# Patient Record
Sex: Female | Born: 1975 | Race: Asian | Hispanic: No | Marital: Married | Smoking: Never smoker
Health system: Southern US, Community
[De-identification: ages and names within clinical notes are randomized; demographics above are authoritative.]

## PROBLEM LIST (undated history)

## (undated) DIAGNOSIS — E785 Hyperlipidemia, unspecified: Secondary | ICD-10-CM

## (undated) DIAGNOSIS — IMO0002 Reserved for concepts with insufficient information to code with codable children: Secondary | ICD-10-CM

## (undated) DIAGNOSIS — E010 Iodine-deficiency related diffuse (endemic) goiter: Secondary | ICD-10-CM

## (undated) DIAGNOSIS — R000 Tachycardia, unspecified: Secondary | ICD-10-CM

## (undated) DIAGNOSIS — D649 Anemia, unspecified: Secondary | ICD-10-CM

## (undated) DIAGNOSIS — K219 Gastro-esophageal reflux disease without esophagitis: Secondary | ICD-10-CM

## (undated) HISTORY — PX: WISDOM TOOTH EXTRACTION: SHX21

## (undated) HISTORY — DX: Hyperlipidemia, unspecified: E78.5

---

## 1998-12-06 ENCOUNTER — Ambulatory Visit (HOSPITAL_COMMUNITY): Admission: RE | Admit: 1998-12-06 | Discharge: 1998-12-06 | Payer: Self-pay | Admitting: *Deleted

## 1998-12-10 ENCOUNTER — Ambulatory Visit (HOSPITAL_COMMUNITY): Admission: RE | Admit: 1998-12-10 | Discharge: 1998-12-10 | Payer: Self-pay | Admitting: *Deleted

## 2001-09-22 ENCOUNTER — Other Ambulatory Visit: Admission: RE | Admit: 2001-09-22 | Discharge: 2001-09-22 | Payer: Self-pay | Admitting: Endocrinology

## 2004-11-11 ENCOUNTER — Other Ambulatory Visit: Admission: RE | Admit: 2004-11-11 | Discharge: 2004-11-11 | Payer: Self-pay | Admitting: Obstetrics and Gynecology

## 2005-11-20 ENCOUNTER — Inpatient Hospital Stay (HOSPITAL_COMMUNITY): Admission: AD | Admit: 2005-11-20 | Discharge: 2005-11-20 | Payer: Self-pay | Admitting: *Deleted

## 2006-02-19 ENCOUNTER — Inpatient Hospital Stay (HOSPITAL_COMMUNITY): Admission: AD | Admit: 2006-02-19 | Discharge: 2006-02-22 | Payer: Self-pay | Admitting: Obstetrics and Gynecology

## 2006-04-02 ENCOUNTER — Other Ambulatory Visit: Admission: RE | Admit: 2006-04-02 | Discharge: 2006-04-02 | Payer: Self-pay | Admitting: Obstetrics and Gynecology

## 2007-01-14 ENCOUNTER — Ambulatory Visit (HOSPITAL_COMMUNITY): Admission: RE | Admit: 2007-01-14 | Discharge: 2007-01-14 | Payer: Self-pay | Admitting: Obstetrics and Gynecology

## 2007-01-14 ENCOUNTER — Encounter (INDEPENDENT_AMBULATORY_CARE_PROVIDER_SITE_OTHER): Payer: Self-pay | Admitting: Specialist

## 2007-10-25 ENCOUNTER — Encounter: Admission: RE | Admit: 2007-10-25 | Discharge: 2007-10-25 | Payer: Self-pay | Admitting: Endocrinology

## 2010-08-22 ENCOUNTER — Encounter: Admission: RE | Admit: 2010-08-22 | Discharge: 2010-08-22 | Payer: Self-pay | Admitting: Endocrinology

## 2010-09-23 ENCOUNTER — Other Ambulatory Visit
Admission: RE | Admit: 2010-09-23 | Discharge: 2010-09-23 | Payer: Self-pay | Source: Home / Self Care | Admitting: Interventional Radiology

## 2010-09-23 ENCOUNTER — Encounter
Admission: RE | Admit: 2010-09-23 | Discharge: 2010-09-23 | Payer: Self-pay | Source: Home / Self Care | Attending: Endocrinology | Admitting: Endocrinology

## 2010-10-12 NOTE — L&D Delivery Note (Signed)
Delivery Note At 4:08 PM a viable female was delivered via Vaginal, Spontaneous Delivery (Presentation: Right Occiput Anterior).  APGAR: 9, 9; weight 6 lb 11.2 oz (3039 g).   Placenta status: Intact, Spontaneous.  Cord: 3 vessels with the following complications: None.  Cord pH: not sent  Anesthesia: Epidural  Episiotomy: None Lacerations: 1st degree;Perineal;Vaginal Suture Repair: 2.0 vicryl Est. Blood Loss (mL): 300cc  Mom to postpartum.  Baby to nursery-stable.  Purcell Nails 06/16/2011, 5:37 PM

## 2010-11-25 LAB — CBC
HCT: 36 % (ref 36–46)
Platelets: 349 10*3/uL (ref 150–399)

## 2010-11-25 LAB — HEPATITIS B SURFACE ANTIGEN: Hepatitis B Surface Ag: NEGATIVE

## 2010-11-25 LAB — ANTIBODY SCREEN: Antibody Screen: NEGATIVE

## 2010-11-25 LAB — RUBELLA ANTIBODY, IGM: Rubella: IMMUNE

## 2010-11-25 LAB — ABO/RH

## 2011-02-27 NOTE — H&P (Signed)
NAME:  Zoe Brown, Zoe Brown                     ACCOUNT NO.:  [id number]   MEDICAL RECORD NO.:  [id number]          PATIENT TYPE:  MAT   LOCATION:  MATC                          [facility]ILITY:  [facility]   PHYSICIAN:  Naima A. Dillard, M.D. DATE OF BIRTH:  1976/08/27   DATE OF ADMISSION:  DATE OF DISCHARGE:                                HISTORY & PHYSICAL   Ms. Fujita is a 35 year old Chinese married female, gravida 2, para 0-0-1-0 who  presents to the office at 38-6/[redacted] weeks gestation reporting watery vaginal  discharge since this morning.  She reports irregular contractions  previously, but none now.  She reports previous bloody show, but none now.  She denies signs and symptoms of preeclampsia.  Her pregnancy has been  followed by the [facility] and  has been remarkable for  1.  Varicella nonimmune.  2.  Family history of hypertension.  3.  Positive group B Strep bacteriuria.   Her prenatal labs were collected on August 03, 2005.  Her hemoglobin was  10.5, hematocrit 33.1, platelets 359,000.  Blood type A+, antibody negative,  RPR nonreactive, rubella immune, hepatitis B surface antigen negative, HIV  nonreactive.  Pap smear from January 2006, was normal.  Gonorrhea negative,  chlamydia negative.  Cystic fibrosis negative and varicella nonimmune.  Her  1-hour Glucola from December 09, 2005, was 159 and her hemoglobin at that  time was 9.9.  Her RPR at that time was nonreactive.  Her 3-hour glucose  tolerance test from December 21, 2005, was normal.  Urine culture from August 03, 2005, was positive for group B Strep.   HISTORY OF PRESENT PREGNANCY:  The patient presented for care at [facility] on August 03, 2005, at 10-2/[redacted] weeks gestation.  She had an  ultrasound on that day giving her an [facility] of Feb 27, 2006.  Her urine was  sent to culture grew group B Strep.  The urinary tract infection was treated  with Penicillin Vee K and the patient completed that  treatment.  Of note.  The patient's quad screen was within normal limits.  Pregnancy  ultrasonography at 18 weeks shows growth consistent with previous dating.  The patient had an low renal threshold for glucose with a normal finger  sticks periodically during the pregnancy.  Ultrasonography at 26-1/2 weeks  shows growth consistent with previous dating.  The patient decided at 36  weeks to have C.N.M. care for her labor and birth.  Rest for prenatal care  was unremarkable.   OBSTETRIC HISTORY:  She is a graft gravida 2, para 0-0-1-0.  In February of  2000, she had a term elective AB at 12 weeks.   MEDICAL HISTORY:  No medication allergies.  She experienced menarche at the  age of 13-1/2 with 37 days cycles lasting 7 days.  She has used condoms in  the past for contraception.  She is immune to rubella.  She reports never  having had chickenpox.  She has taking Nexium in the past for heartburn.   SURGICAL HISTORY:  Remarkable for EAB in 2000 and a tooth extraction in  March 2006.   FAMILY MEDICAL HISTORY:  Remarkable for mother and maternal grandfather with  heart disease, multiple family members with hypertension, mother and  maternal grandfather with varicosities, maternal grandfather with diabetes.  Maternal grandfather and paternal grandfather with CVA.   GENETIC HISTORY:  Negative.   SOCIAL HISTORY:  The patient is married to the father of the baby, his name  is Astronomer.  He is involved and supportive.  Their first language is [place] but  they both speak English very well.  Father of the baby has two masters  degrees and is employed full-time as an Acupuncturist.  The patient  has a master's degree and is employed full-time as a Furniture conservator/restorer.  They deny any alcohol, tobacco or illicit drug use with the pregnancy.   OBJECTIVE:  VITAL SIGNS:  Stable.  She is afebrile.  HEENT:  Grossly within normal limits.  CHEST:  Clear to auscultation.  HEART:  Regular rate and  rhythm.  ABDOMEN:  Gravid in contour with fundal height extending approximately 37 cm  above the pubic symphysis.  Fetal heart rate is Dopplered in the 150s.  Uterine contractions are irregular per patient report.  Sterile speculum exam shows positive pooling, negative Nitrazine, positive  ferning.  Cervix is posterior, 1-2 cm, 70%, vertex -1.  EXTREMITIES:  Normal.   ASSESSMENT:  1.  Intrauterine pregnancy at term.  2.  Spontaneous rupture of membranes with no labor.  3.  Group B Strep positive.   PLAN:  1.  Admit to [facility]  2.  Routine C.N.M. orders.  3.  Begin penicillin for group B Strep prophylaxis.  4.  Further plan of care to be made with Chip Boer L. Emilee Hero, certified nurse      midwife      Cam Hai, C.N.M.      Naima A. Normand Sloop, M.D.  Electronically Signed    [place]/MEDQ  D:  02/19/2006  T:  02/19/2006  Job:  [id number]

## 2011-02-27 NOTE — Op Note (Signed)
Zoe Brown of Zoe Brown  [name] Visit Number: [id number] MRN: [id number]          Service Type: DSU Location: [facility] Attending Physician:  Zoe Brown Proc. Date: 12/06/98 Admit Date:  12/06/1998   CC:         Zoe Brown, M.D.                           Operative Report  PREOPERATIVE DIAGNOSES:       1. Intrauterine pregnancy at [redacted] weeks gestational ge                                  with request for termination.                               2. Rhodium (Rh) positive.  POSTOPERATIVE DIAGNOSES:      1. Intrauterine pregnancy at [redacted] weeks gestational ge                                  with request for termination.                               2. Rhodium (Rh) positive.  OPERATION:                    Dilatation and evacuation.  SURGEON:                      Zoe Brown, M.D.  ANESTHESIA:                   IV sedation and paracervical block.  ESTIMATED BLOOD LOSS:         80 cc.  TUBES AND DRAINS:             None.  COMPLICATIONS:                None.  INDICATIONS:                  35 year old woman, G-1, P-0, last menstrual period September 28, 1998, with first trimester pregnancy, who requests pregnancy termination.  FINDINGS:                     Preoperative uterine size 8-10 weeks, retroverted. Postoperative uterine size was 6-8 weeks.  Tissue obtained on curettage. Uterine cavity smooth.  SPECIMENS:                    Uterine curetting sent to pathology.  DESCRIPTION OF PROCEDURE:     After the establishment of IV sedation, the patient was placed in the dorsal lithotomy position.  The perineum and vagina were prepped with Betadine solution.  The patient was draped.  Examination under anesthesia or the above findings was carried out.  Graves speculum was inserted into the vagina and the cervix was reprepped with Betadine solution.  The anterior cervical lip was infiltrated with 1% Xylocaine and then grasped with a  single-toothed tenaculum.  Paracervical block was placed in the usual fashion using 1% Xylocaine (20 cc).  Uterine was sound was used to negotiate the direction of the  endocervical canal. Pratt dilators were used to dilate the cervix to #29 [place].  A #9 Simpson curet was passed easily into the endometrial cavity.  Suction curettage was performed. Tissue was obtained.  Gentle sharp curettage was performed.  A final pass with he suction curet was made.  The uterus was felt to be empty and smooth at the end f the case.  The instruments were removed and hemostasis was noted.  The patient as returned to the supine position and transferred to the recovery room in satisfactory condition. Attending Physician:  Zoe Brown DD:  12/06/98 TD:  12/06/98 Job: [id number]

## 2011-02-27 NOTE — Op Note (Signed)
NAME:  Zoe Brown, Zoe Brown                     ACCOUNT NO.:  [id number]   MEDICAL RECORD NO.:  [id number]          PATIENT TYPE:  AMB   LOCATION:  SDC                           [facility]:  [facility]   PHYSICIAN:  Maxie Better, M.D.DATE OF BIRTH:  07-12-1976   DATE OF PROCEDURE:  01/14/2007  DATE OF DISCHARGE:                               OPERATIVE REPORT   PREOPERATIVE DIAGNOSIS:  Elective termination.   PROCEDURE:  1. Suction dilation evacuation.  2. Endocervical polypectomy.   POSTOPERATIVE DIAGNOSES:  1. Elective termination.  2. Endocervical polyp.   ANESTHESIA:  MAC paracervical block.   SURGEON:  Maxie Better, M.D.   PROCEDURE:  Under adequate monitored anesthesia, the patient was placed  in the dorsal lithotomy position.  She was sterilely prepped and draped  in the usual fashion.  The bladder was catheterized for small amount of  urine.  Examination under anesthesia revealed about an 8 weeks size  uterus.  No adnexal masses could be appreciated.  A bivalve speculum was  placed in the vagina.  Twenty 20 mL of 1% Nesacaine was injected  paracervically at 3 and 9 o'clock.  The cervical os was notable for  protruding mass consistent with a polyp.  The single-tooth tenaculum was  placed on the anterior lip of the cervix.  The endocervical polyp was  removed with a polyp forcep.  The cervix was then serially dilated up to  #29 Pratt dilator and #7 mm curved suction cannula was introduced into  the uterine cavity.  Large amount of products of conception was then  obtained.  A #8 mm curved suction cannula was introduced into the  uterine cavity.  Additional tissue was obtained.  The cavity was then  suction curetted and suctioned until all tissue was felt to have been  removed at which time all instruments were then removed.  Specimen  labeled products of conception, endocervical polyp was sent to  pathology.  Estimated blood loss was minimal.  Complication was none.  The patient  tolerated the procedure well and was transferred to the  recovery room in stable condition.      Maxie Better, M.D.  Electronically Signed     [place]/MEDQ  D:  01/14/2007  T:  01/15/2007  Job:  [id number]

## 2011-03-17 ENCOUNTER — Other Ambulatory Visit: Payer: Self-pay

## 2011-04-02 ENCOUNTER — Other Ambulatory Visit: Payer: Self-pay

## 2011-04-18 IMAGING — US US BIOPSY
1 series · 12 of 12 positions shown · non-contrast
Comparison: 08/22/2010

CLINICAL DATA: Dominant complex cystic left lower pole thyroid
nodule

ULTRASOUND GUIDED NEEDLE ASPIRATE BIOPSY OF THE THYROID GLAND

[Series 1: us biopsy · 0.06mm/px · 12 acquisitions, 12 frames shown]
[im 1/12]
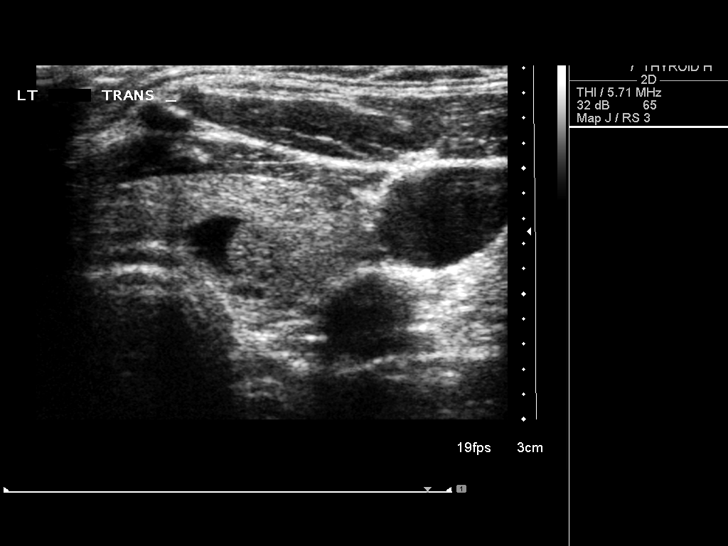
[im 2/12]
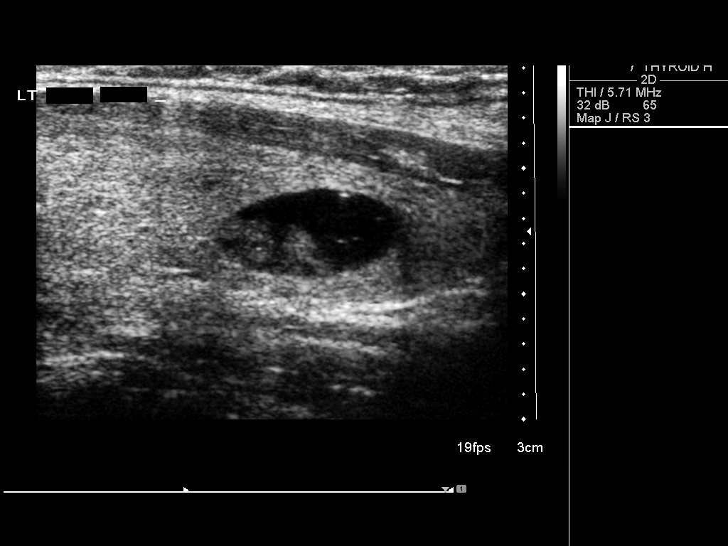
[im 3/12]
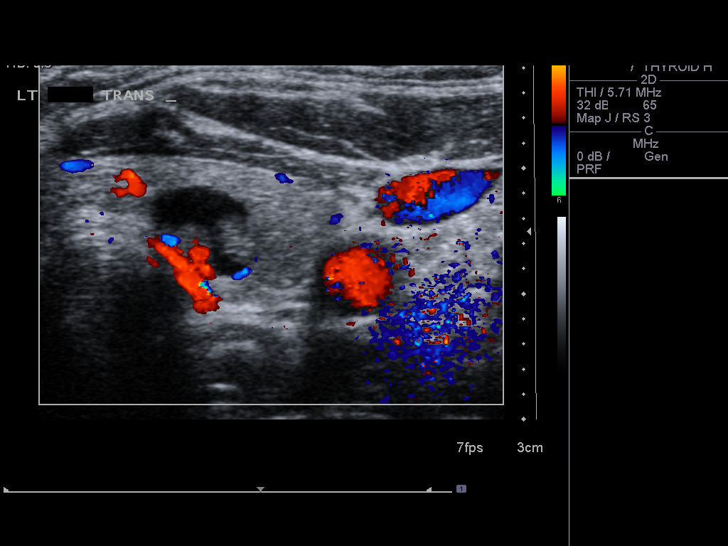
[im 4/12]
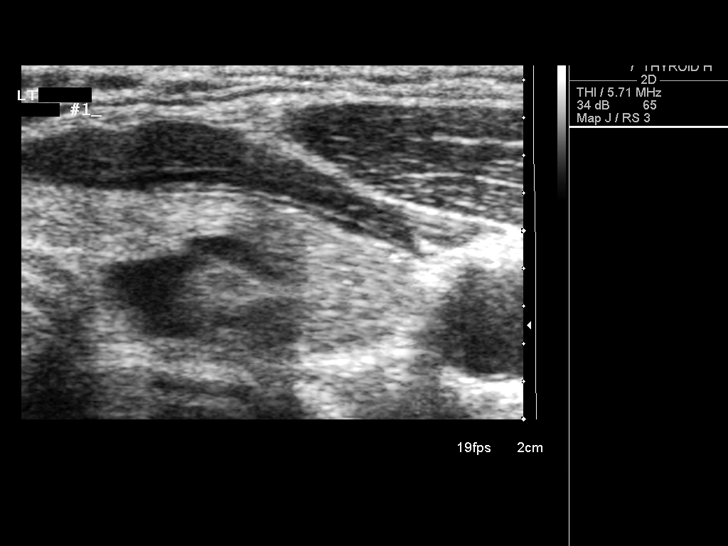
[im 5/12]
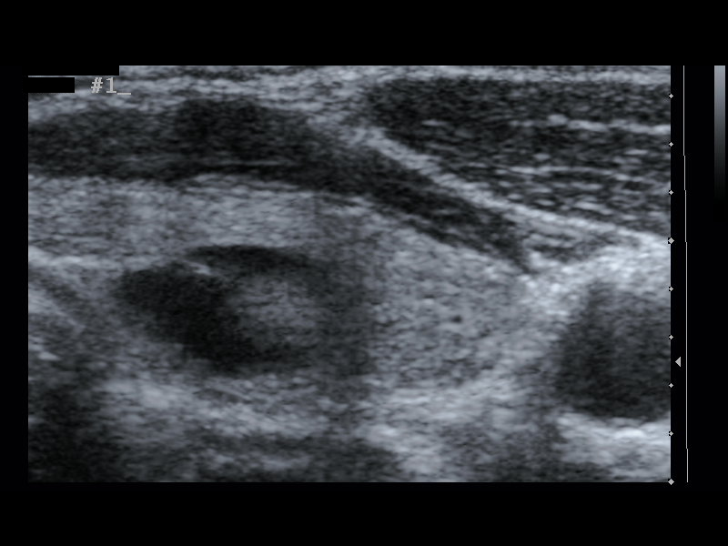
[im 6/12]
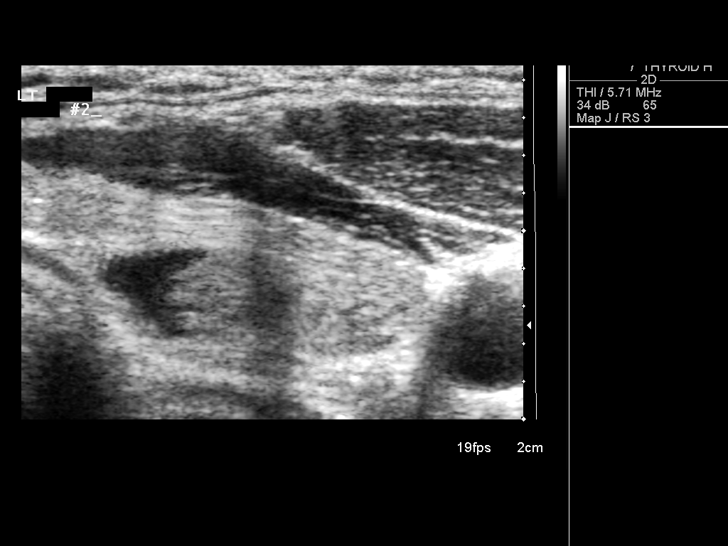
[im 7/12]
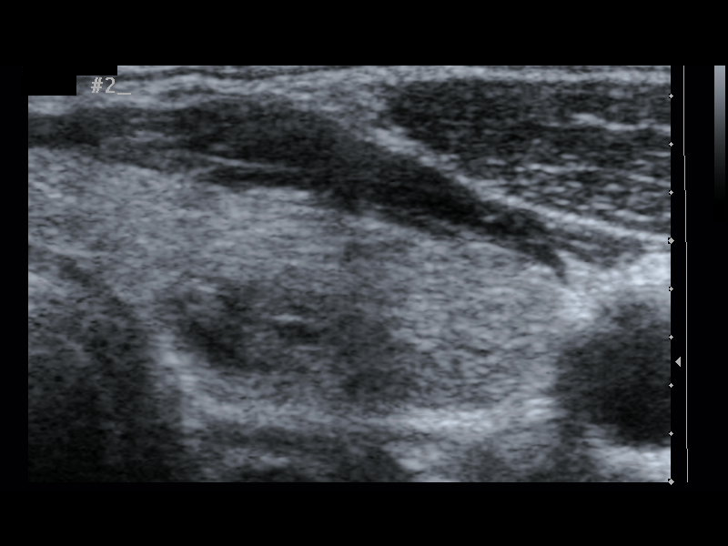
[im 8/12]
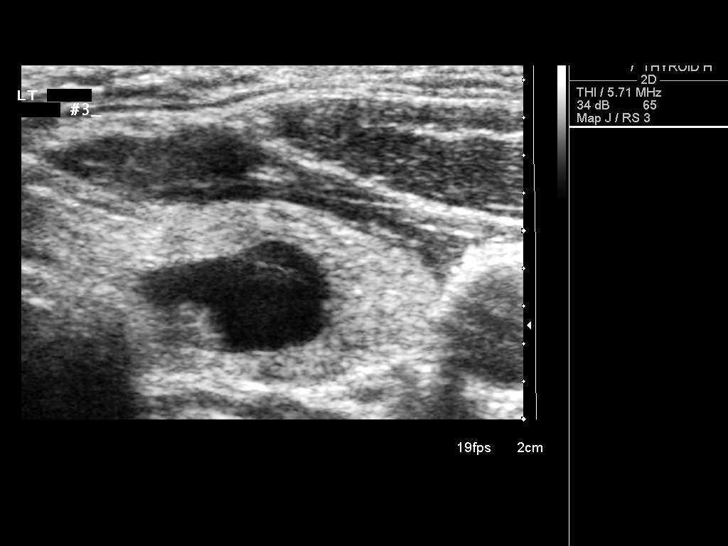
[im 9/12]
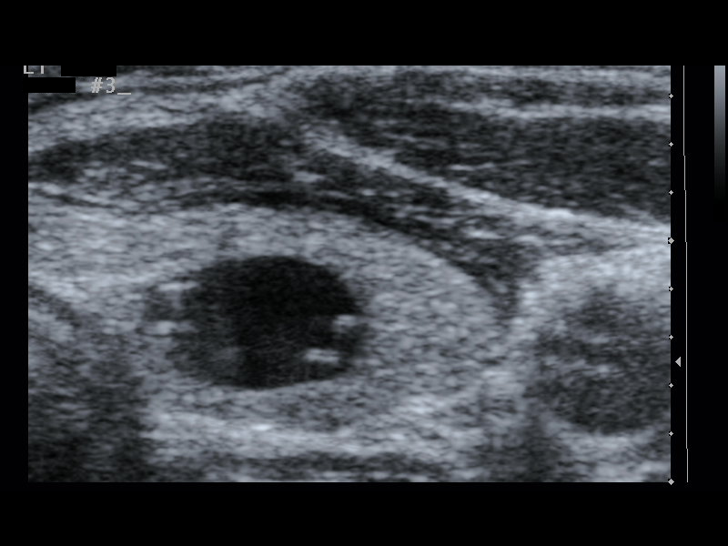
[im 10/12]
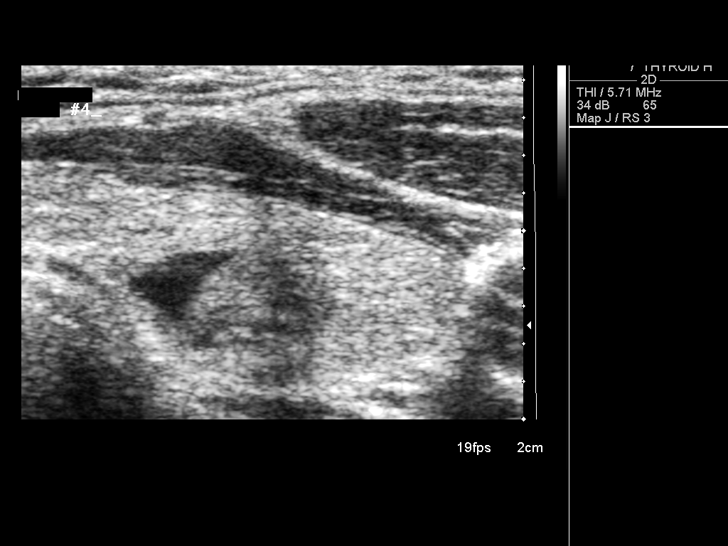
[im 11/12]
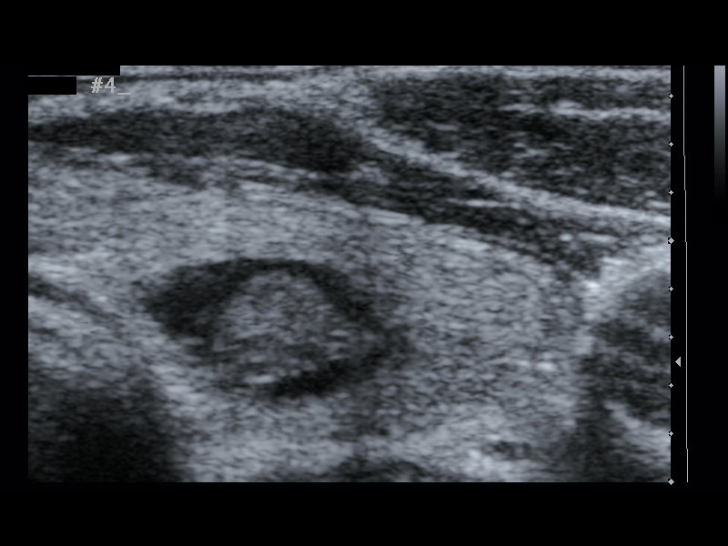
[im 12/12]
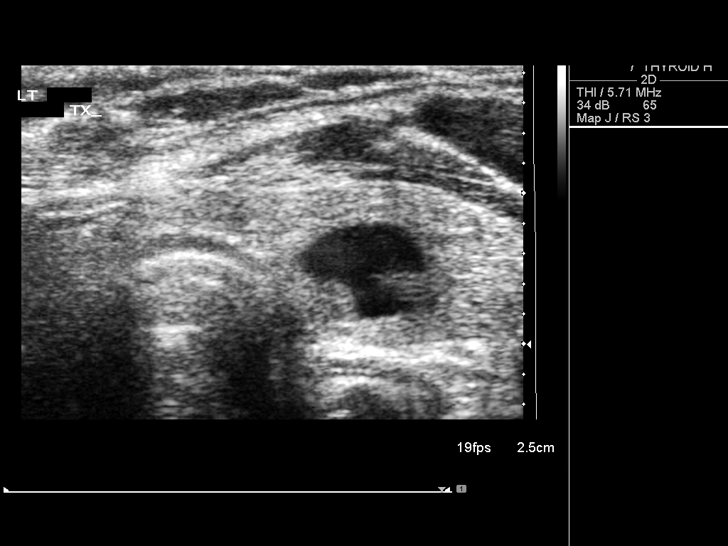

[12 of 12 positions shown; findings below may reference images not displayed]

Thyroid biopsy was thoroughly discussed with the patient and
questions were answered.  The benefits, risks, alternatives, and
complications were also discussed.  The patient understands and
wishes to proceed with the procedure.  Written consent was
obtained.

Ultrasound was performed to localize and mark an adequate site for
the biopsy.  The patient was then prepped and draped in a normal
sterile fashion.  Local anesthesia was provided with 1% lidocaine.
Using direct ultrasound guidance, 4 passes were made using 25 gauge
needles into the nodule within the left lobe of the thyroid.
Ultrasound was used to confirm needle placements on all occasions.
Specimens were sent to Pathology for analysis.

Complications:  No immediate
FINDINGS: Imaging confirms needle placement in the complex cystic
nodule in the left lower thyroid lobe
IMPRESSION: Ultrasound guided needle aspirate biopsy performed of the left
lower complex cystic thyroid nodule.

## 2011-05-28 ENCOUNTER — Ambulatory Visit (HOSPITAL_COMMUNITY)
Admission: RE | Admit: 2011-05-28 | Discharge: 2011-05-28 | Disposition: A | Discharge status: Home / Self Care | Payer: BC Managed Care – PPO | Source: Ambulatory Visit | Attending: Obstetrics and Gynecology | Admitting: Obstetrics and Gynecology

## 2011-05-28 ENCOUNTER — Encounter: Payer: BC Managed Care – PPO | Attending: Obstetrics and Gynecology | Admitting: Dietician

## 2011-05-28 DIAGNOSIS — O99810 Abnormal glucose complicating pregnancy: Secondary | ICD-10-CM | POA: Insufficient documentation

## 2011-05-28 DIAGNOSIS — Z713 Dietary counseling and surveillance: Secondary | ICD-10-CM | POA: Insufficient documentation

## 2011-05-28 NOTE — ED Notes (Signed)
Diabetes Counseling and Meter instruction: Ht:64 in Wt:162.75 lb EDC:06/21/2011  G2P1 3 Hr GTT: Fasting: 80 1 Hr: 187, 2 Hr:180, 3 Hr: 124. Denies history of GDM with her previous pregnancy.  Was swimming, but has stopped when diagnosed with GDM. I encouraged her to get back to exercise, try walking 30 minutes per day.  Completed review of the diet for GDM and the recommendations along with blood glucose monitoring education.  Provided Handouts: 1. Nutrition, Diabetes, and Pregnancy 2. Carbohydrate Counting Brochure 3. Pregnancy/Glucose Illustrations.  Provided an Accu-Chek Nano blood glucose monitor and instruction on proper use.  She is to monitor fasting and 2 hour post-meal blood glucose levels.  Her glucose was 101, mid afternoon.  She has my card and is to call with questions.  Maggie Michole Lecuyer, RN, RD, CDE.

## 2011-06-06 ENCOUNTER — Inpatient Hospital Stay (HOSPITAL_COMMUNITY)
Admission: AD | Admit: 2011-06-06 | Discharge: 2011-06-06 | Disposition: A | Discharge status: Home / Self Care | Payer: BC Managed Care – PPO | Source: Ambulatory Visit | Attending: Obstetrics and Gynecology | Admitting: Obstetrics and Gynecology

## 2011-06-06 ENCOUNTER — Encounter (HOSPITAL_COMMUNITY): Payer: Self-pay

## 2011-06-06 DIAGNOSIS — O368190 Decreased fetal movements, unspecified trimester, not applicable or unspecified: Secondary | ICD-10-CM

## 2011-06-06 HISTORY — DX: Gastro-esophageal reflux disease without esophagitis: K21.9

## 2011-06-06 NOTE — Progress Notes (Signed)
Decreased fetal movement since this morning, drank cold water and had popsicle baby only moved two times.

## 2011-06-06 NOTE — ED Provider Notes (Signed)
History     Chief Complaint  Patient presents with  . Decreased Fetal Movement   HPI Pt presents with complaint of decreased fetal movement this morning.  She states her fetus is usually very active in the morning.  She states she drank water and had a popsicle and didn't feel the baby move around very much.  This made her concerned.  She is followed by the CNM service of [facility].  This pregnancy has been unremarkable with the exception of failed one hour glucola, one abnormal value on 3hr GTT.  Due to presence of glycosuria, she has been checking blood sugars fasting and 2hrs post prandial and she states that the values have all been WNL over the past week.    OB History    Grav Para Term Preterm Abortions TAB SAB Ect Mult Living   4 1 1  2 2    1       Past Medical History  Diagnosis Date  . GERD (gastroesophageal reflux disease)     Past Surgical History  Procedure Date  . No past surgeries   . Wisdom tooth extraction     Family History  Problem Relation Age of Onset  . Heart disease Mother   . Hypertension Mother   . Hypertension Father   . Heart disease Maternal Grandmother   . Hypertension Maternal Grandmother   . Heart disease Maternal Grandfather   . Hypertension Maternal Grandfather   . Stroke Maternal Grandfather   . Hypertension Paternal Grandmother   . Hypertension Paternal Grandfather   . Diabetes Paternal Grandfather   . Stroke Paternal Grandfather     History  Substance Use Topics  . Smoking status: Never Smoker   . Smokeless tobacco: Not on file  . Alcohol Use: No    Allergies: No Known Allergies  Prescriptions prior to admission  Medication Sig Dispense Refill  . IRON PO Take 1 tablet by mouth daily.        . prenatal vitamin w/FE, FA (PRENATAL 1 + 1) 27-1 MG TABS Take 1 tablet by mouth daily.          Review of Systems  Constitutional: Negative.   HENT: Negative.   Eyes: Negative.   Respiratory: Negative.   Cardiovascular: Negative.     Gastrointestinal: Negative.   Genitourinary: Negative.   Musculoskeletal: Negative.   Skin: Negative.   Neurological: Negative.   Endo/Heme/Allergies: Negative.   Psychiatric/Behavioral: Negative.    Physical Exam   Blood pressure 116/68, pulse 70, temperature 97.9 F (36.6 C), temperature source Oral, resp. rate 16, height 5\' 4"  (1.626 m), weight 73.392 kg (161 lb 12.8 oz).  Physical Exam  Constitutional: She is oriented to person, place, and time. She appears well-developed and well-nourished.  HENT:  Head: Normocephalic and atraumatic.  Right Ear: External ear normal.  Left Ear: External ear normal.  Nose: Nose normal.  Eyes: Pupils are equal, round, and reactive to light.  Neck: Normal range of motion. Neck supple. No thyromegaly present.  Cardiovascular: Normal rate, regular rhythm and normal heart sounds.   Respiratory: Effort normal and breath sounds normal.  GI: Soft. Bowel sounds are normal.  Musculoskeletal: Normal range of motion.  Neurological: She is alert and oriented to person, place, and time. She has normal reflexes.  Skin: Skin is warm and dry.  Psychiatric: She has a normal mood and affect. Her behavior is normal. Judgment and thought content normal.   Fetal heart rate baseline 130 with moderate variability present.  Accels present.  Reactive NST.  No decels noted.  Rare irregular mild uterine contraction present.  Category I FHR tracing.  MAU Course  Procedures NST  MDM  Assessment and Plan  IUP at 37w 6d Decreased fetal movement Reactive NST  Discharge to home.   Fetal kick count instructions given. RTO as sched at [facility] on Wednesday, 06/10/11. Reviewed signs/symptoms of labor.  Renuka Farfan O. 06/06/2011, 12:30 PM

## 2011-06-16 ENCOUNTER — Inpatient Hospital Stay (HOSPITAL_COMMUNITY): Payer: BC Managed Care – PPO | Admitting: Anesthesiology

## 2011-06-16 ENCOUNTER — Encounter (HOSPITAL_COMMUNITY): Payer: Self-pay | Admitting: *Deleted

## 2011-06-16 ENCOUNTER — Inpatient Hospital Stay (HOSPITAL_COMMUNITY)
Admission: AD | Admit: 2011-06-16 | Discharge: 2011-06-18 | DRG: 372 | Disposition: A | Discharge status: Home / Self Care | Payer: BC Managed Care – PPO | Source: Ambulatory Visit | Attending: Obstetrics and Gynecology | Admitting: Obstetrics and Gynecology

## 2011-06-16 ENCOUNTER — Encounter (HOSPITAL_COMMUNITY): Payer: Self-pay | Admitting: Anesthesiology

## 2011-06-16 DIAGNOSIS — IMO0002 Reserved for concepts with insufficient information to code with codable children: Secondary | ICD-10-CM

## 2011-06-16 DIAGNOSIS — O24410 Gestational diabetes mellitus in pregnancy, diet controlled: Secondary | ICD-10-CM

## 2011-06-16 DIAGNOSIS — O99814 Abnormal glucose complicating childbirth: Secondary | ICD-10-CM | POA: Diagnosis present

## 2011-06-16 DIAGNOSIS — O09529 Supervision of elderly multigravida, unspecified trimester: Secondary | ICD-10-CM | POA: Diagnosis present

## 2011-06-16 DIAGNOSIS — O4700 False labor before 37 completed weeks of gestation, unspecified trimester: Secondary | ICD-10-CM

## 2011-06-16 HISTORY — DX: Anemia, unspecified: D64.9

## 2011-06-16 HISTORY — DX: Iodine-deficiency related diffuse (endemic) goiter: E01.0

## 2011-06-16 HISTORY — DX: Reserved for concepts with insufficient information to code with codable children: IMO0002

## 2011-06-16 HISTORY — DX: Tachycardia, unspecified: R00.0

## 2011-06-16 LAB — CBC
HCT: 35.2 % — ABNORMAL LOW (ref 36.0–46.0)
MCH: 22.6 pg — ABNORMAL LOW (ref 26.0–34.0)
MCHC: 31.8 g/dL (ref 30.0–36.0)
MCV: 71.1 fL — ABNORMAL LOW (ref 78.0–100.0)
RDW: 15 % (ref 11.5–15.5)

## 2011-06-16 LAB — GLUCOSE, CAPILLARY
Glucose-Capillary: 98 mg/dL (ref 70–99)
Glucose-Capillary: 80 mg/dL (ref 70–99)
Glucose-Capillary: 93 mg/dL (ref 70–99)

## 2011-06-16 LAB — STREP B DNA PROBE: GBS: NEGATIVE

## 2011-06-16 MED ORDER — PHENYLEPHRINE 40 MCG/ML (10ML) SYRINGE FOR IV PUSH (FOR BLOOD PRESSURE SUPPORT)
80.0000 ug | PREFILLED_SYRINGE | INTRAVENOUS | Status: DC | PRN
  Filled 2011-06-16 (×2): qty 5

## 2011-06-16 MED ORDER — FLEET ENEMA 7-19 GM/118ML RE ENEM: 1.0000 | ENEMA | RECTAL | Status: DC | PRN

## 2011-06-16 MED ORDER — OXYCODONE-ACETAMINOPHEN 5-325 MG PO TABS
2.0000 | ORAL_TABLET | ORAL | Status: DC | PRN
  Filled 2011-06-16 (×3): qty 2

## 2011-06-16 MED ORDER — FENTANYL 2.5 MCG/ML BUPIVACAINE 1/10 % EPIDURAL INFUSION (WH - ANES)
14.0000 mL/h | INTRAMUSCULAR | Status: DC
  Administered 2011-06-16: 14 mL/h via EPIDURAL
  Filled 2011-06-16: qty 60

## 2011-06-16 MED ORDER — IBUPROFEN 600 MG PO TABS
600.0000 mg | ORAL_TABLET | Freq: Four times a day (QID) | ORAL | Status: DC | PRN
  Filled 2011-06-16 (×6): qty 1

## 2011-06-16 MED ORDER — TERBUTALINE SULFATE 1 MG/ML IJ SOLN: 0.2500 mg | Freq: Once | INTRAMUSCULAR | Status: AC | PRN

## 2011-06-16 MED ORDER — IBUPROFEN 600 MG PO TABS
600.0000 mg | ORAL_TABLET | Freq: Four times a day (QID) | ORAL | Status: DC
  Administered 2011-06-16 – 2011-06-18 (×8): 600 mg via ORAL
  Filled 2011-06-16 (×2): qty 1

## 2011-06-16 MED ORDER — ONDANSETRON HCL 4 MG PO TABS: 4.0000 mg | ORAL_TABLET | ORAL | Status: DC | PRN

## 2011-06-16 MED ORDER — ACETAMINOPHEN 325 MG PO TABS: 650.0000 mg | ORAL_TABLET | ORAL | Status: DC | PRN

## 2011-06-16 MED ORDER — WITCH HAZEL-GLYCERIN EX PADS: 1.0000 "application " | MEDICATED_PAD | CUTANEOUS | Status: DC | PRN

## 2011-06-16 MED ORDER — TETANUS-DIPHTH-ACELL PERTUSSIS 5-2.5-18.5 LF-MCG/0.5 IM SUSP
0.5000 mL | Freq: Once | INTRAMUSCULAR | Status: AC
  Administered 2011-06-17: 0.5 mL via INTRAMUSCULAR
  Filled 2011-06-16: qty 0.5

## 2011-06-16 MED ORDER — OXYTOCIN BOLUS FROM INFUSION
500.0000 mL | Freq: Once | INTRAVENOUS | Status: DC
  Filled 2011-06-16: qty 500

## 2011-06-16 MED ORDER — PHENYLEPHRINE 40 MCG/ML (10ML) SYRINGE FOR IV PUSH (FOR BLOOD PRESSURE SUPPORT)
80.0000 ug | PREFILLED_SYRINGE | INTRAVENOUS | Status: DC | PRN
  Filled 2011-06-16: qty 5

## 2011-06-16 MED ORDER — ZOLPIDEM TARTRATE 5 MG PO TABS: 5.0000 mg | ORAL_TABLET | Freq: Every evening | ORAL | Status: DC | PRN

## 2011-06-16 MED ORDER — DIBUCAINE 1 % RE OINT: 1.0000 "application " | TOPICAL_OINTMENT | RECTAL | Status: DC | PRN

## 2011-06-16 MED ORDER — LANOLIN HYDROUS EX OINT: TOPICAL_OINTMENT | CUTANEOUS | Status: DC | PRN

## 2011-06-16 MED ORDER — SIMETHICONE 80 MG PO CHEW: 80.0000 mg | CHEWABLE_TABLET | ORAL | Status: DC | PRN

## 2011-06-16 MED ORDER — PRENATAL PLUS 27-1 MG PO TABS
1.0000 | ORAL_TABLET | Freq: Every day | ORAL | Status: DC
  Administered 2011-06-17 – 2011-06-18 (×2): 1 via ORAL
  Filled 2011-06-16 (×2): qty 1

## 2011-06-16 MED ORDER — LIDOCAINE HCL (PF) 1 % IJ SOLN
30.0000 mL | INTRAMUSCULAR | Status: DC | PRN
  Filled 2011-06-16 (×2): qty 30

## 2011-06-16 MED ORDER — LACTATED RINGERS IV SOLN
INTRAVENOUS | Status: DC
  Administered 2011-06-16: 13:00:00 via INTRAVENOUS

## 2011-06-16 MED ORDER — OXYCODONE-ACETAMINOPHEN 5-325 MG PO TABS
1.0000 | ORAL_TABLET | ORAL | Status: DC | PRN
  Filled 2011-06-16: qty 2

## 2011-06-16 MED ORDER — CITRIC ACID-SODIUM CITRATE 334-500 MG/5ML PO SOLN: 30.0000 mL | ORAL | Status: DC | PRN

## 2011-06-16 MED ORDER — DIPHENHYDRAMINE HCL 50 MG/ML IJ SOLN: 12.5000 mg | INTRAMUSCULAR | Status: DC | PRN

## 2011-06-16 MED ORDER — OXYTOCIN 20 UNITS IN LACTATED RINGERS INFUSION - SIMPLE: 125.0000 mL/h | Freq: Once | INTRAVENOUS | Status: DC

## 2011-06-16 MED ORDER — EPHEDRINE 5 MG/ML INJ
10.0000 mg | INTRAVENOUS | Status: DC | PRN
  Filled 2011-06-16 (×2): qty 4

## 2011-06-16 MED ORDER — ONDANSETRON HCL 4 MG/2ML IJ SOLN: 4.0000 mg | Freq: Four times a day (QID) | INTRAMUSCULAR | Status: DC | PRN

## 2011-06-16 MED ORDER — LACTATED RINGERS IV SOLN
500.0000 mL | Freq: Once | INTRAVENOUS | Status: AC
  Administered 2011-06-16: 1000 mL via INTRAVENOUS

## 2011-06-16 MED ORDER — DIPHENHYDRAMINE HCL 25 MG PO CAPS: 25.0000 mg | ORAL_CAPSULE | Freq: Four times a day (QID) | ORAL | Status: DC | PRN

## 2011-06-16 MED ORDER — BENZOCAINE-MENTHOL 20-0.5 % EX AERO: 1.0000 "application " | INHALATION_SPRAY | CUTANEOUS | Status: DC | PRN

## 2011-06-16 MED ORDER — EPHEDRINE 5 MG/ML INJ
10.0000 mg | INTRAVENOUS | Status: DC | PRN
  Filled 2011-06-16: qty 4

## 2011-06-16 MED ORDER — LACTATED RINGERS IV SOLN: 500.0000 mL | INTRAVENOUS | Status: DC | PRN

## 2011-06-16 MED ORDER — HYDROXYZINE HCL 50 MG PO TABS
50.0000 mg | ORAL_TABLET | Freq: Four times a day (QID) | ORAL | Status: DC | PRN
  Filled 2011-06-16: qty 1

## 2011-06-16 MED ORDER — ONDANSETRON HCL 4 MG/2ML IJ SOLN: 4.0000 mg | INTRAMUSCULAR | Status: DC | PRN

## 2011-06-16 MED ORDER — SENNOSIDES-DOCUSATE SODIUM 8.6-50 MG PO TABS
2.0000 | ORAL_TABLET | Freq: Every day | ORAL | Status: DC
  Administered 2011-06-17: 2 via ORAL

## 2011-06-16 MED ORDER — OXYTOCIN 20 UNITS IN LACTATED RINGERS INFUSION - SIMPLE
1.0000 m[IU]/min | INTRAVENOUS | Status: DC
  Administered 2011-06-16: 2 m[IU]/min via INTRAVENOUS
  Administered 2011-06-16: 333 m[IU]/min via INTRAVENOUS
  Filled 2011-06-16: qty 1000

## 2011-06-16 MED ORDER — HYDROXYZINE HCL 50 MG/ML IM SOLN
50.0000 mg | Freq: Four times a day (QID) | INTRAMUSCULAR | Status: DC | PRN
  Filled 2011-06-16: qty 1

## 2011-06-16 NOTE — Anesthesia Procedure Notes (Signed)
Epidural Patient location during procedure: OB  Staffing Anesthesiologist: Jiles Garter  Preanesthetic Checklist Completed: patient identified, site marked, surgical consent, pre-op evaluation, timeout performed, IV checked, risks and benefits discussed and monitors and equipment checked  Epidural Patient position: sitting Prep: site prepped and draped and DuraPrep Patient monitoring: continuous pulse ox and blood pressure Approach: midline Injection technique: LOR air  Needle:  Needle type: Tuohy  Needle gauge: 17 G Needle length: 9 cm Needle insertion depth: 5 cm cm Catheter type: closed end flexible Catheter size: 19 Gauge Catheter at skin depth: 10 cm Test dose: negative  Assessment Events: blood not aspirated, injection not painful, no injection resistance, negative IV test and no paresthesia  Additional Notes Dosing of Epidural: 1st dose, Through needle...... 5mg  Marcaine 2nd dose, through catheter.... epi 1:200K + Xylocaine 40 mg 3rd dose, through catheter...Marland KitchenMarland Kitchenepi 1:200K + Xylocaine 60 mg Each dose occurred after waiting 3 min,patient was free of IV sx; and patient exhibits no evidence of SA injection  Patient is more comfortable after epidural dosed. Please see RN's note for documentation of vital signs,and FHR which are stable.

## 2011-06-16 NOTE — H&P (Signed)
Zoe Brown is a 35 y.o. asian female presenting with CC SROM around 2300. At present unsure if desires epidural as labor progresses. Maternal Medical History:  Reason for admission: Reason for admission: rupture of membranes and contractions.  Contractions: Onset was 3-5 hours ago.   Frequency: irregular.   Perceived severity is moderate.    Fetal activity: Perceived fetal activity is normal.   Last perceived fetal movement was within the past hour.    Prenatal complications: 1.  AMA 2.  GDM-diet controlled 3.  Sl lang barrier 4.  H/o irreg HR/tachycardia 5.  Thyromegaly/cyst--pt declined med rec'd by PCP 6.  H/o ulcer/GERD 7.  H/o long cycles 8.  H/o anemia 9.  EABx2  Prenatal Complications - Diabetes: gestational. Diabetes is managed by diet.   Pt's 1hrgtt=138; 3hr with only 1 abnl, however, as pregnancy progressed, glucosuria with elevated random CBG's at office appts, no long after 3 hr gtt, so pt treated as diabetic, and referred to [facility], but has controlled with diet.  OB History    Grav Para Term Preterm Abortions TAB SAB Ect Mult Living   4 1 1  2 2    1      Past Medical History  Diagnosis Date  . GERD (gastroesophageal reflux disease)   . Anemia   . Thyromegaly     cyst-declined meds  . Tachycardia    Past Surgical History  Procedure Date  . Wisdom tooth extraction    Family History: family history includes Diabetes in her paternal grandfather; Heart disease in her maternal grandfather, maternal grandmother, and mother; Hypertension in her father, maternal grandfather, maternal grandmother, mother, paternal grandfather, and paternal grandmother; and Stroke in her maternal grandfather and paternal grandfather. Social History:  reports that she has never smoked. She does not have any smokeless tobacco history on file. She reports that she does not drink alcohol or use illicit drugs.  Review of Systems  Constitutional: Negative.     Respiratory: Negative.   Cardiovascular: Negative.   Gastrointestinal: Negative.   Genitourinary: Negative.   Musculoskeletal: Negative.     Dilation: 1.5 Effacement (%): 50 Station: -2 Blood pressure 105/57, pulse 82, temperature 97.8 F (36.6 C), temperature source Oral, resp. rate 20, height 5\' 4"  (1.626 m), weight 73.936 kg (163 lb). Maternal Exam:  Uterine Assessment: Contraction strength is mild.  Contraction frequency is irregular.   Abdomen: Patient reports no abdominal tenderness. Fetal presentation: vertex  Introitus: Normal vulva. Ferning test: negative.  Nitrazine test: not done. Amniotic fluid character: clear. amnisure positive  Pelvis: adequate for delivery.   Cervix: Cervix evaluated by sterile speculum exam and digital exam.     Fetal Exam Fetal Monitor Review: Mode: ultrasound.   Baseline rate: 130.  Variability: moderate (6-25 bpm).   Pattern: no decelerations and accelerations present.    Fetal State Assessment: Category I - tracings are normal.    Fasting CBG in MAU=103  Physical Exam  Constitutional: She is oriented to person, place, and time. She appears well-developed and well-nourished.  Cardiovascular: Normal rate and regular rhythm.   Respiratory: Effort normal and breath sounds normal.       Breathing w/ some ctxs and sometimes grimace  GI: Soft. Bowel sounds are normal.  Genitourinary: Vagina normal.  Musculoskeletal: Normal range of motion. She exhibits edema.       Mild gen to 1+ edema BLE; Rt>Lt; pedal and peri-ankle  Neurological: She is alert and oriented to person, place, and time. She  has normal reflexes.    Prenatal labs: ABO, Rh: A/Positive/-- (02/14 0000) Antibody: Negative (02/14 0000) Rubella:  immune RPR: Nonreactive (02/14 0000)  HBsAg: Negative (02/14 0000)  HIV: Non-reactive (02/14 0000)  GBS:   negative 1st trimester screen negative amnisure positive  Assessment/Plan: 1.  IUP 39.2 wks 2.  Prelabor  ROM--rupture time 2300 3.  GBS negative 4.  GDM-diet controlled 5.  irreg ctxs 6.  Cat I/reactive FHT  1.  Admit to BS with dr. Normand Sloop as attending 2.  Observe for now for further progression in labor; Pitocin prn augmentation 3.  Rout L&D orders 4.  May have light cho-modified breakfast tray 5.  CBG's q 2 hrs at present 6.  MD to follow  Zoe Brown H 06/16/2011, 7:17 AM

## 2011-06-16 NOTE — Initial Assessments (Signed)
Report called to C.Madilyn Fireman for transfer of care

## 2011-06-16 NOTE — Anesthesia Preprocedure Evaluation (Signed)
Anesthesia Evaluation  Name, MR# and DOB Patient awake  General Assessment Comment  Reviewed: Allergy & Precautions, H&P , Patient's Chart, lab work & pertinent test results  Airway Mallampati: II TM Distance: >3 FB Neck ROM: full    Dental  (+) Teeth Intact   Pulmonary  clear to auscultation  breath sounds clear to auscultation none    Cardiovascular regular Normal    Neuro/Psych   GI/Hepatic/Renal   Endo/Other  (+) Gestational,     Abdominal   Musculoskeletal   Hematology   Peds  Reproductive/Obstetrics (+) Pregnancy    Anesthesia Other Findings                 Anesthesia Physical Anesthesia Plan  ASA: III  Anesthesia Plan: Epidural   Post-op Pain Management:    Induction:   Airway Management Planned:   Additional Equipment:   Intra-op Plan:   Post-operative Plan:   Informed Consent: I have reviewed the patients History and Physical, chart, labs and discussed the procedure including the risks, benefits and alternatives for the proposed anesthesia with the patient or authorized representative who has indicated his/her understanding and acceptance.   Dental Advisory Given  Plan Discussed with: CRNA and Surgeon  Anesthesia Plan Comments: (Labs checked- platelets confirmed with RN in room. Fetal heart tracing, per RN, reportedly stable enough for sitting procedure. Discussed epidural, and patient consents to the procedure:  included risk of possible headache,backache, failed block, allergic reaction, and nerve injury. This patient was asked if she had any questions or concerns before the procedure started. )       Anesthesia Quick Evaluation  

## 2011-06-17 LAB — CBC
Hemoglobin: 10.9 g/dL — ABNORMAL LOW (ref 12.0–15.0)
MCHC: 31.9 g/dL (ref 30.0–36.0)
RDW: 15 % (ref 11.5–15.5)
WBC: 14.1 10*3/uL — ABNORMAL HIGH (ref 4.0–10.5)

## 2011-06-17 NOTE — Progress Notes (Signed)
Post Partum Day 1 Subjective:  Well. Lochia are normal. Voiding, ambulating, tolerating normal diet. nursing going well. Normal BM today  Objective: Blood pressure 104/63, pulse 80, temperature 98.3 F (36.8 C), temperature source Oral, resp. rate 18, height 5\' 4"  (1.626 m), weight 73.936 kg (163 lb), SpO2 97.00%, unknown if currently breastfeeding.  Physical Exam:  General: normal Lochia: appropriate Uterine Fundus: 0/1 firm non-tender  Extremities: No evidence of DVT seen on physical exam. Edema minimal     Basename 06/17/11 0550 06/16/11 0705  HGB 10.9* 11.2*  HCT 34.2* 35.2*    Assessment/Plan: Normal Post-partum. Continue routine post-partum care. Anticipate discharge tomorrow Circumcision reviewed and questions answered    LOS: 1 day   Casey Maxfield A MD 06/17/2011, 5:01 PM

## 2011-06-17 NOTE — Anesthesia Postprocedure Evaluation (Signed)
  Anesthesia Post-op Note  Patient: Zoe Brown  Procedure(s) Performed: * No procedures listed *  Patient Location: PACU and Mother/Baby  Anesthesia Type: Epidural  Level of Consciousness: awake, alert , oriented, patient cooperative and responds to stimulation  Airway and Oxygen Therapy: Patient Spontanous Breathing  Post-op Pain: none  Post-op Assessment: Post-op Vital signs reviewed, Patient's Cardiovascular Status Stable, Respiratory Function Stable, Patent Airway, No signs of Nausea or vomiting, Adequate PO intake and Pain level controlled  Post-op Vital Signs: Reviewed and stable  Complications: No apparent anesthesia complications

## 2011-06-18 MED ORDER — IBUPROFEN 600 MG PO TABS: 600.0000 mg | ORAL_TABLET | Freq: Four times a day (QID) | ORAL | Status: AC | PRN

## 2011-06-18 MED ORDER — OXYCODONE-ACETAMINOPHEN 5-325 MG PO TABS: 1.0000 | ORAL_TABLET | Freq: Four times a day (QID) | ORAL | Status: AC | PRN

## 2011-06-18 NOTE — Progress Notes (Signed)
Post Partum Day2 Subjective: no complaints, voiding and tolerating PO and breast feeding  Objective: Afebrile VSS  Physical Exam:  General: alert Lochia: appropriate Uterine Fundus: firm  DVT Evaluation: No evidence of DVT seen on physical exam.   Basename 06/17/11 0550 06/16/11 0705  HGB 10.9* 11.2*  HCT 34.2* 35.2*    Assessment/Plan: PPD 2 PtBreast feeding Routine care   LOS: 2 days   Yashira Offenberger A 06/18/2011, 11:53 AM

## 2011-06-18 NOTE — Discharge Summary (Signed)
  Obstetric Discharge Summary Reason for Admission: onset of labor and GDM Prenatal Procedures: NST and ultrasound Intrapartum Procedures: spontaneous vaginal delivery Postpartum Procedures: none Complications-Operative and Postpartum: none  Temp:  [98 F (36.7 C)-98.3 F (36.8 C)] 98.1 F (36.7 C) (09/06 0604) Pulse Rate:  [69-80] 69  (09/06 0604) Resp:  [18] 18  (09/06 0604) BP: (104-120)/(63-73) 120/73 mmHg (09/06 0604) Hemoglobin  Date Value Range Status  06/17/2011 10.9* 12.0-15.0 (g/dL) Final     HCT  Date Value Range Status  06/17/2011 34.2* 36.0-46.0 (%) Final    Hospital Course:   Hospital Course:   The patient came in labor and had a SVD by Dr Su Hilt.  Post partum she did well.  She tolerated a regular diet and her exam is benign.bs were well maintained.  She has recovered well and is ready for discharge.  She is BF and is unsure what to use for [facility].      Discharge Diagnoses: Term Pregnancy-delivered and GDM  Discharge Information: Date: 06/18/2011 Activity: pelvic rest Diet: routine Medications:  Medication List  As of 06/18/2011 11:55 AM   ASK your doctor about these medications         IRON PO      prenatal vitamin w/FE, FA 27-1 MG Tabs           Condition: stable Instructions: refer to practice specific booklet Discharge to: home   Newborn Data: Live born  Information for the patient's newborn:  Kenneth, Lax [[id number]]  female ; APGAR , ; weight ;  Home with mother.  Destinae Neubecker A 06/18/2011, 11:55 AM

## 2011-06-21 ENCOUNTER — Inpatient Hospital Stay (HOSPITAL_COMMUNITY): Admission: RE | Admit: 2011-06-21 | Payer: BC Managed Care – PPO | Source: Ambulatory Visit

## 2011-08-17 ENCOUNTER — Other Ambulatory Visit: Payer: Self-pay | Admitting: Endocrinology

## 2011-08-17 DIAGNOSIS — E041 Nontoxic single thyroid nodule: Secondary | ICD-10-CM

## 2011-09-14 ENCOUNTER — Other Ambulatory Visit: Payer: BC Managed Care – PPO

## 2011-12-14 ENCOUNTER — Ambulatory Visit (INDEPENDENT_AMBULATORY_CARE_PROVIDER_SITE_OTHER): Payer: BC Managed Care – PPO | Admitting: Obstetrics and Gynecology

## 2011-12-14 DIAGNOSIS — Z01419 Encounter for gynecological examination (general) (routine) without abnormal findings: Secondary | ICD-10-CM

## 2012-12-22 ENCOUNTER — Ambulatory Visit: Payer: BC Managed Care – PPO | Admitting: Obstetrics and Gynecology

## 2014-03-20 ENCOUNTER — Other Ambulatory Visit: Payer: Self-pay | Admitting: Obstetrics and Gynecology

## 2014-03-28 ENCOUNTER — Encounter (HOSPITAL_COMMUNITY): Payer: Self-pay | Admitting: *Deleted

## 2014-04-02 MED ORDER — DOXYCYCLINE HYCLATE 100 MG IV SOLR
100.0000 mg | Freq: Once | INTRAVENOUS | Status: AC
  Administered 2014-04-03: 100 mg via INTRAVENOUS
  Filled 2014-04-02: qty 100

## 2014-04-03 ENCOUNTER — Ambulatory Visit (HOSPITAL_COMMUNITY)
Admission: RE | Admit: 2014-04-03 | Discharge: 2014-04-03 | Disposition: A | Discharge status: Home / Self Care | Payer: BC Managed Care – PPO | Source: Ambulatory Visit | Attending: Obstetrics and Gynecology | Admitting: Obstetrics and Gynecology

## 2014-04-03 ENCOUNTER — Encounter (HOSPITAL_COMMUNITY): Payer: BC Managed Care – PPO | Admitting: Anesthesiology

## 2014-04-03 ENCOUNTER — Encounter (HOSPITAL_COMMUNITY)
Admission: RE | Disposition: A | Discharge status: Home / Self Care | Payer: Self-pay | Source: Ambulatory Visit | Attending: Obstetrics and Gynecology

## 2014-04-03 ENCOUNTER — Encounter (HOSPITAL_COMMUNITY): Payer: Self-pay | Admitting: Registered Nurse

## 2014-04-03 ENCOUNTER — Ambulatory Visit (HOSPITAL_COMMUNITY): Payer: BC Managed Care – PPO | Admitting: Anesthesiology

## 2014-04-03 DIAGNOSIS — N841 Polyp of cervix uteri: Secondary | ICD-10-CM | POA: Insufficient documentation

## 2014-04-03 DIAGNOSIS — D649 Anemia, unspecified: Secondary | ICD-10-CM | POA: Insufficient documentation

## 2014-04-03 DIAGNOSIS — O039 Complete or unspecified spontaneous abortion without complication: Secondary | ICD-10-CM | POA: Insufficient documentation

## 2014-04-03 DIAGNOSIS — Z332 Encounter for elective termination of pregnancy: Principal | ICD-10-CM

## 2014-04-03 DIAGNOSIS — Z640 Problems related to unwanted pregnancy: Secondary | ICD-10-CM

## 2014-04-03 HISTORY — PX: DILATION AND EVACUATION: SHX1459

## 2014-04-03 LAB — CBC
HEMATOCRIT: 34.5 % — AB (ref 36.0–46.0)
Hemoglobin: 11 g/dL — ABNORMAL LOW (ref 12.0–15.0)
MCH: 21.8 pg — ABNORMAL LOW (ref 26.0–34.0)
MCHC: 31.9 g/dL (ref 30.0–36.0)
MCV: 68.3 fL — AB (ref 78.0–100.0)
PLATELETS: 311 10*3/uL (ref 150–400)
RBC: 5.05 MIL/uL (ref 3.87–5.11)
RDW: 15.3 % (ref 11.5–15.5)
WBC: 10.4 10*3/uL (ref 4.0–10.5)

## 2014-04-03 SURGERY — DILATION AND EVACUATION, UTERUS
Anesthesia: Monitor Anesthesia Care | Site: Uterus

## 2014-04-03 MED ORDER — PROPOFOL 10 MG/ML IV BOLUS
INTRAVENOUS | Status: DC | PRN
  Administered 2014-04-03: 20 mg via INTRAVENOUS
  Administered 2014-04-03 (×3): 10 mg via INTRAVENOUS
  Administered 2014-04-03 (×3): 20 mg via INTRAVENOUS
  Administered 2014-04-03: 10 mg via INTRAVENOUS
  Administered 2014-04-03: 20 mg via INTRAVENOUS

## 2014-04-03 MED ORDER — PROPOFOL 10 MG/ML IV EMUL
INTRAVENOUS | Status: AC
Start: 2014-04-03 — End: 2014-04-03
  Filled 2014-04-03: qty 20

## 2014-04-03 MED ORDER — LIDOCAINE HCL (CARDIAC) 20 MG/ML IV SOLN
INTRAVENOUS | Status: AC
  Filled 2014-04-03: qty 5

## 2014-04-03 MED ORDER — KETOROLAC TROMETHAMINE 30 MG/ML IJ SOLN
INTRAMUSCULAR | Status: AC
  Filled 2014-04-03: qty 2

## 2014-04-03 MED ORDER — LIDOCAINE HCL (CARDIAC) 20 MG/ML IV SOLN
INTRAVENOUS | Status: DC | PRN
  Administered 2014-04-03: 2 mg via INTRAVENOUS

## 2014-04-03 MED ORDER — MEPERIDINE HCL 25 MG/ML IJ SOLN: 6.2500 mg | INTRAMUSCULAR | Status: DC | PRN

## 2014-04-03 MED ORDER — MIDAZOLAM HCL 2 MG/2ML IJ SOLN
INTRAMUSCULAR | Status: AC
  Filled 2014-04-03: qty 2

## 2014-04-03 MED ORDER — MIDAZOLAM HCL 2 MG/2ML IJ SOLN
INTRAMUSCULAR | Status: DC | PRN
  Administered 2014-04-03: 2 mg via INTRAVENOUS

## 2014-04-03 MED ORDER — LACTATED RINGERS IV SOLN
INTRAVENOUS | Status: DC
  Administered 2014-04-03 (×2): via INTRAVENOUS

## 2014-04-03 MED ORDER — KETOROLAC TROMETHAMINE 30 MG/ML IJ SOLN
INTRAMUSCULAR | Status: AC
  Filled 2014-04-03: qty 1

## 2014-04-03 MED ORDER — PROPOFOL 10 MG/ML IV EMUL
INTRAVENOUS | Status: AC
  Filled 2014-04-03: qty 20

## 2014-04-03 MED ORDER — FENTANYL CITRATE 0.05 MG/ML IJ SOLN: 25.0000 ug | INTRAMUSCULAR | Status: DC | PRN

## 2014-04-03 MED ORDER — IBUPROFEN 800 MG PO TABS: 800.0000 mg | ORAL_TABLET | Freq: Three times a day (TID) | ORAL | Status: DC | PRN

## 2014-04-03 MED ORDER — ONDANSETRON HCL 4 MG/2ML IJ SOLN
INTRAMUSCULAR | Status: AC
  Filled 2014-04-03: qty 2

## 2014-04-03 MED ORDER — KETOROLAC TROMETHAMINE 60 MG/2ML IM SOLN
INTRAMUSCULAR | Status: DC | PRN
  Administered 2014-04-03: 60 mg via INTRAMUSCULAR

## 2014-04-03 MED ORDER — DEXAMETHASONE SODIUM PHOSPHATE 10 MG/ML IJ SOLN
INTRAMUSCULAR | Status: AC
  Filled 2014-04-03: qty 1

## 2014-04-03 MED ORDER — ONDANSETRON HCL 4 MG/2ML IJ SOLN
INTRAMUSCULAR | Status: AC
Start: 2014-04-03 — End: 2014-04-03
  Filled 2014-04-03: qty 2

## 2014-04-03 MED ORDER — FENTANYL CITRATE 0.05 MG/ML IJ SOLN
INTRAMUSCULAR | Status: AC
  Filled 2014-04-03: qty 2

## 2014-04-03 MED ORDER — METOCLOPRAMIDE HCL 5 MG/ML IJ SOLN: 10.0000 mg | Freq: Once | INTRAMUSCULAR | Status: DC | PRN

## 2014-04-03 MED ORDER — CHLOROPROCAINE HCL 1 % IJ SOLN
INTRAMUSCULAR | Status: AC
  Filled 2014-04-03: qty 30

## 2014-04-03 MED ORDER — CHLOROPROCAINE HCL 1 % IJ SOLN
INTRAMUSCULAR | Status: DC | PRN
  Administered 2014-04-03: 30 mL

## 2014-04-03 MED ORDER — ONDANSETRON HCL 4 MG/2ML IJ SOLN
INTRAMUSCULAR | Status: DC | PRN
  Administered 2014-04-03: 4 mg via INTRAVENOUS

## 2014-04-03 MED ORDER — KETOROLAC TROMETHAMINE 30 MG/ML IJ SOLN
15.0000 mg | Freq: Once | INTRAMUSCULAR | Status: DC | PRN
Start: 2014-04-03 — End: 2014-04-03

## 2014-04-03 MED ORDER — FENTANYL CITRATE 0.05 MG/ML IJ SOLN
INTRAMUSCULAR | Status: DC | PRN
  Administered 2014-04-03 (×2): 50 ug via INTRAVENOUS

## 2014-04-03 SURGICAL SUPPLY — 18 items
CATH ROBINSON RED A/P 16FR (CATHETERS) ×3 IMPLANT
CLOTH BEACON ORANGE TIMEOUT ST (SAFETY) ×3 IMPLANT
DECANTER SPIKE VIAL GLASS SM (MISCELLANEOUS) ×3 IMPLANT
GLOVE BIOGEL PI IND STRL 7.0 (GLOVE) ×2 IMPLANT
GLOVE BIOGEL PI INDICATOR 7.0 (GLOVE) ×4
GLOVE ECLIPSE 6.5 STRL STRAW (GLOVE) ×3 IMPLANT
GOWN STRL REUS W/TWL LRG LVL3 (GOWN DISPOSABLE) ×6 IMPLANT
KIT BERKELEY 1ST TRIMESTER 3/8 (MISCELLANEOUS) ×3 IMPLANT
NS IRRIG 1000ML POUR BTL (IV SOLUTION) ×3 IMPLANT
PACK VAGINAL MINOR WOMEN LF (CUSTOM PROCEDURE TRAY) ×3 IMPLANT
PAD OB MATERNITY 4.3X12.25 (PERSONAL CARE ITEMS) ×3 IMPLANT
PAD PREP 24X48 CUFFED NSTRL (MISCELLANEOUS) ×3 IMPLANT
SET BERKELEY SUCTION TUBING (SUCTIONS) ×3 IMPLANT
TOWEL OR 17X24 6PK STRL BLUE (TOWEL DISPOSABLE) ×6 IMPLANT
VACURETTE 10 RIGID CVD (CANNULA) IMPLANT
VACURETTE 7MM CVD STRL WRAP (CANNULA) ×2 IMPLANT
VACURETTE 8 RIGID CVD (CANNULA) ×2 IMPLANT
VACURETTE 9 RIGID CVD (CANNULA) IMPLANT

## 2014-04-03 NOTE — Anesthesia Preprocedure Evaluation (Addendum)
Anesthesia Evaluation  Patient identified by MRN, date of birth, ID band Patient awake    Reviewed: Allergy & Precautions, H&P , NPO status , Patient's Chart, lab work & pertinent test results, reviewed documented beta blocker date and time   History of Anesthesia Complications Negative for: history of anesthetic complications  Airway Mallampati: I TM Distance: >3 FB Neck ROM: full    Dental  (+) Teeth Intact   Pulmonary neg pulmonary ROS,  breath sounds clear to auscultation        Cardiovascular negative cardio ROS  Rhythm:regular Rate:Normal     Neuro/Psych negative neurological ROS  negative psych ROS   GI/Hepatic negative GI ROS, Neg liver ROS, PUD (h/o),   Endo/Other  negative endocrine ROSneg diabetesHad virus affecting thyroid that caused tachycardia - has resolved.  Renal/GU negative Renal ROS  negative genitourinary   Musculoskeletal   Abdominal   Peds  Hematology  (+) anemia ,   Anesthesia Other Findings   Reproductive/Obstetrics (+) Pregnancy (undesired pregnancy)                          Anesthesia Physical Anesthesia Plan  ASA: II  Anesthesia Plan: MAC   Post-op Pain Management:    Induction:   Airway Management Planned:   Additional Equipment:   Intra-op Plan:   Post-operative Plan:   Informed Consent: I have reviewed the patients History and Physical, chart, labs and discussed the procedure including the risks, benefits and alternatives for the proposed anesthesia with the patient or authorized representative who has indicated his/her understanding and acceptance.     Plan Discussed with: Surgeon and CRNA  Anesthesia Plan Comments:         Anesthesia Quick Evaluation

## 2014-04-03 NOTE — Discharge Instructions (Signed)
CALL  IF TEMP>100.4, NOTHING PER VAGINA X 2 WK, CALL IF SOAKING A MAXI  PAD EVERY HOUR OR MORE FREQUENTLYDISCHARGE INSTRUCTIONS: D&C / D&E The following instructions have been prepared to help you care for yourself upon your return home.   Personal hygiene:  Use sanitary pads for vaginal drainage, not tampons.  Shower the day after your procedure.  NO tub baths, pools or Jacuzzis for 2-3 weeks.  Wipe front to back after using the bathroom.  Activity and limitations:  Do NOT drive or operate any equipment for 24 hours. The effects of anesthesia are still present and drowsiness may result.  Do NOT rest in bed all day.  Walking is encouraged.  Walk up and down stairs slowly.  You may resume your normal activity in one to two days or as indicated by your physician.  Sexual activity: NO intercourse for at least 2 weeks after the procedure, or as indicated by your physician.  Diet: Eat a light meal as desired this evening. You may resume your usual diet tomorrow.  Return to work: You may resume your work activities in one to two days or as indicated by your doctor.  What to expect after your surgery: Expect to have vaginal bleeding/discharge for 2-3 days and spotting for up to 10 days. It is not unusual to have soreness for up to 1-2 weeks. You may have a slight burning sensation when you urinate for the first day. Mild cramps may continue for a couple of days. You may have a regular period in 2-6 weeks.  NO IBUPROFEN PRODUCTS (MOTRIN, ADVIL) OR ALEVE UNTIL 6:00PM TODAY.    Call your doctor for any of the following:  Excessive vaginal bleeding, saturating and changing one pad every hour.  Inability to urinate 6 hours after discharge from hospital.  Pain not relieved by pain medication.  Fever of 100.4 F or greater.  Unusual vaginal discharge or odor.   Call for an appointment:    Patients signature: ______________________  Nurses signature  ________________________  Support person's signature_______________________

## 2014-04-03 NOTE — Transfer of Care (Signed)
Immediate Anesthesia Transfer of Care Note  Patient: Zoe Brown  Procedure(s) Performed: Procedure(s): DILATATION AND EVACUATION (N/A)  Patient Location: PACU  Anesthesia Type:MAC  Level of Consciousness: awake, alert , oriented and patient cooperative  Airway & Oxygen Therapy: Patient Spontanous Breathing  Post-op Assessment: Report given to PACU RN and Post -op Vital signs reviewed and stable  Post vital signs: Reviewed and stable  Complications: No apparent anesthesia complications

## 2014-04-03 NOTE — Anesthesia Postprocedure Evaluation (Signed)
  Anesthesia Post-op Note  Anesthesia Post Note  Patient: Zoe Brown  Procedure(s) Performed: Procedure(s) (LRB): DILATATION AND EVACUATION (N/A)  Anesthesia type: MAC  Patient location: PACU  Post pain: Pain level controlled  Post assessment: Post-op Vital signs reviewed  Last Vitals:  Filed Vitals:   04/03/14 1315  BP: 111/64  Pulse: 56  Temp: 36.6 C  Resp: 16    Post vital signs: Reviewed  Level of consciousness: sedated  Complications: No apparent anesthesia complications

## 2014-04-03 NOTE — Brief Op Note (Signed)
04/03/2014  12:39 PM  PATIENT:  Zoe Brown  38 y.o. female  PRE-OPERATIVE DIAGNOSIS:  Undesired Pregnancy  POST-OPERATIVE DIAGNOSIS:  Undesired Pregnancy, endocervical polyp  PROCEDURE:  Suction dilation and evacuation, cervical polyp removal  SURGEON:  Surgeon(s) and Role:    * Sheronette A Cousins, MD - Primary  PHYSICIAN ASSISTANT:   ASSISTANTS: none   ANESTHESIA:   paracervical block and MAC Findings: cervical polyp, 9-10 weeks AV uterus, no adnexal mass EBL:  Total I/O In: 1000 [I.V.:1000] Out: 250 [Urine:150; Blood:100]  BLOOD ADMINISTERED:none  DRAINS: none   LOCAL MEDICATIONS USED:  OTHER nesicaine  SPECIMEN:  Source of Specimen:  POC, cervical polyp  DISPOSITION OF SPECIMEN:  PATHOLOGY  COUNTS:  YES  TOURNIQUET:  * No tourniquets in log *  DICTATION: .Other Dictation: Dictation Number [id number]  PLAN OF CARE: Discharge to home after PACU  PATIENT DISPOSITION:  PACU - hemodynamically stable.   Delay start of Pharmacological VTE agent (>24hrs) due to surgical blood loss or risk of bleeding: no

## 2014-04-04 ENCOUNTER — Encounter (HOSPITAL_COMMUNITY): Payer: Self-pay | Admitting: Obstetrics and Gynecology

## 2014-04-04 NOTE — Op Note (Signed)
NAMAbigail Butts:  Baugh, Kanesha                     ACCOUNT NO.:  [id number]  MEDICAL RECORD NO.:  [id number]  LOCATION:  [facility]                          [facility]ACILITY:  [facility]  PHYSICIAN:  Maxie BetterSheronette Cousins, M.D.DATE OF BIRTH:  [birth year]  DATE OF PROCEDURE:  04/03/2014 DATE OF DISCHARGE:  04/03/2014                              OPERATIVE REPORT   PREOPERATIVE DIAGNOSIS:  Undesired pregnancy.  PROCEDURES:  Suction dilation and  Evacuation, removal of endocervical polyp.  POSTOPERATIVE DIAGNOSIS:  Undesired pregnancy, endocervical polyp.  ANESTHESIA:  MAC, paracervical block.  SURGEON:  Maxie BetterSheronette Cousins, MD.  ASSISTANT:  None.  DESCRIPTION OF PROCEDURE:  Under adequate monitored anesthesia, the patient was placed in the dorsal lithotomy position.  She was sterilely prepped and draped in usual fashion.  The bladder was catheterized for large amount of urine.  Examination under anesthesia revealed anteverted uterus about 9-10 weeks size.  Bivalve speculum was placed in the vagina.  A 30 mL of 1% Nesacaine was injected paracervically at the 3 and 9'oclock position and circumferentially. There was about 1 cm polypoid lesion noted which was removed with the polyp forceps.  A single-tooth tenaculum was placed on the anterior lip of the cervix.  The cervix was serially dilated up to #31 [facility] dilator.  A #8 mm curved suction cannula was introduced into the uterine cavity.  Large amount of tissue was obtained.  The cannula was removed.  The cavity was then curetted and once again suctioned, and then the cannula was decreased to a #7 mm curved one.  The cavity was once again curetted and suctioned until all tissue was felt to be removed, at which time all instruments were then removed from the vagina.  SPECIMEN LABELED:  Endocervical polyp and products of conception were sent to Pathology.  ESTIMATED BLOOD LOSS:  30 mL.  COMPLICATION:  None.  The patient tolerated procedure well, was transferred to  recovery in stable condition.     Maxie BetterSheronette Cousins, M.D.     [place]/MEDQ  D:  04/03/2014  T:  04/04/2014  Job:  [id number]

## 2014-04-05 ENCOUNTER — Telehealth: Payer: Self-pay | Admitting: *Deleted

## 2014-04-05 NOTE — Telephone Encounter (Signed)
[facility] Edward JollySilva has pathology result for this patient but we did not perform the surgery.  Mindi JunkerMarsha to check at hospital and call back.  Mindi JunkerMarsha called back and hospital will re-enter physician and issue an updated report.  Routing to Lewayne Pauley for final review. Patient agreeable to disposition. Will close encounter

## 2014-07-17 ENCOUNTER — Other Ambulatory Visit: Payer: Self-pay | Admitting: Endocrinology

## 2014-07-17 DIAGNOSIS — E041 Nontoxic single thyroid nodule: Secondary | ICD-10-CM

## 2014-07-24 ENCOUNTER — Other Ambulatory Visit: Payer: BC Managed Care – PPO

## 2014-07-25 ENCOUNTER — Ambulatory Visit
Admission: RE | Admit: 2014-07-25 | Discharge: 2014-07-25 | Disposition: A | Discharge status: Home / Self Care | Payer: BC Managed Care – PPO | Source: Ambulatory Visit | Attending: Endocrinology | Admitting: Endocrinology

## 2014-07-25 DIAGNOSIS — E041 Nontoxic single thyroid nodule: Secondary | ICD-10-CM

## 2014-08-03 ENCOUNTER — Encounter: Payer: Self-pay | Admitting: *Deleted

## 2014-08-03 ENCOUNTER — Encounter: Payer: BC Managed Care – PPO | Attending: Emergency Medicine | Admitting: *Deleted

## 2014-08-03 VITALS — Ht 63.0 in | Wt 149.5 lb

## 2014-08-03 DIAGNOSIS — E7800 Pure hypercholesterolemia, unspecified: Secondary | ICD-10-CM

## 2014-08-03 DIAGNOSIS — Z713 Dietary counseling and surveillance: Secondary | ICD-10-CM | POA: Diagnosis not present

## 2014-08-03 NOTE — Progress Notes (Signed)
Medical Nutrition Therapy:  Appt start time: 1100 end time:  1200.  Assessment: Primary concern today: hypercholesterolemia. Total cholesterol 220, LDL 130, HDL 65. She wants to manage this with diet at this time. Not on cholesterol-lowering medications. Her dietary intake consists of traditional [place] food. However, her portions of meat are large and she adds a lot of oil when cooking foods. She also eats sweets and fried snack foods. She does not currently exercise due to being busy with her children. She also reports a desire for weight loss. She has gained about 30 pounds in the last 8 years (10 pounds with each of 2 pregnancies and 10 pounds in 1 year due to eating out frequently).   MEDICATIONS: None   DIETARY INTAKE:   Usual eating pattern includes 3 meals and 2 snacks per day.  24-hr recall:  B ( AM): Honey Nut Cheerios, milk, 1 egg  Snk ( AM): Chocolate, nuts  L ( PM): Pan-fried meat, 1-1.5 cups rice, fried vegetables Snk ( PM): None D ( PM): Same as lunch, but bigger portions Snk ( PM): Nuts, pork bellies Beverages: Water, occasional regular soda  Usual physical activity: None  Estimated energy needs: 1500 calories 188 g carbohydrates 94 g protein 42 g fat  Progress Towards Goal(s):  In progress.   Nutritional Diagnosis:  [place]-2.2 Altered nutrition-related laboratory As related to weight gain and diet high in fat.  As evidenced by elevated cholesterol.    Intervention:  Nutrition counseling. Patient educated on a heart healthy diet, including limiting total, saturated, and trans fats, and cholesterol. We also discussed eating a plant-based diet with increased fruits, vegetables, and whole grains.   Goals:  1. Decrease portion of meat at meals and choose leaner options.  2. Reduce addition on fat/oil to meals.  3. Increase daily physical activity (i.e. Walks with kids) 4. Weight loss of 10 pounds.   Handouts given during visit include:  Heart healthy nutrition  therapy  Monitoring/Evaluation:  Dietary intake, exercise, cholesterol, and body weight prn.

## 2014-08-13 ENCOUNTER — Encounter: Payer: Self-pay | Admitting: *Deleted

## 2016-10-12 DIAGNOSIS — Z0000 Encounter for general adult medical examination without abnormal findings: Secondary | ICD-10-CM | POA: Diagnosis not present

## 2016-10-19 DIAGNOSIS — M25361 Other instability, right knee: Secondary | ICD-10-CM | POA: Diagnosis not present

## 2016-10-27 DIAGNOSIS — M25361 Other instability, right knee: Secondary | ICD-10-CM | POA: Diagnosis not present

## 2016-11-05 DIAGNOSIS — Z0000 Encounter for general adult medical examination without abnormal findings: Secondary | ICD-10-CM | POA: Diagnosis not present

## 2016-11-05 DIAGNOSIS — R7309 Other abnormal glucose: Secondary | ICD-10-CM | POA: Diagnosis not present

## 2016-11-05 DIAGNOSIS — M25361 Other instability, right knee: Secondary | ICD-10-CM | POA: Diagnosis not present

## 2016-11-05 DIAGNOSIS — E789 Disorder of lipoprotein metabolism, unspecified: Secondary | ICD-10-CM | POA: Diagnosis not present

## 2016-11-09 DIAGNOSIS — M25361 Other instability, right knee: Secondary | ICD-10-CM | POA: Diagnosis not present

## 2016-11-12 DIAGNOSIS — Z0000 Encounter for general adult medical examination without abnormal findings: Secondary | ICD-10-CM | POA: Diagnosis not present

## 2016-11-13 ENCOUNTER — Other Ambulatory Visit: Payer: Self-pay | Admitting: Endocrinology

## 2016-11-13 DIAGNOSIS — E041 Nontoxic single thyroid nodule: Secondary | ICD-10-CM

## 2016-11-13 DIAGNOSIS — M25361 Other instability, right knee: Secondary | ICD-10-CM | POA: Diagnosis not present

## 2016-11-16 DIAGNOSIS — M25361 Other instability, right knee: Secondary | ICD-10-CM | POA: Diagnosis not present

## 2016-11-20 ENCOUNTER — Ambulatory Visit
Admission: RE | Admit: 2016-11-20 | Discharge: 2016-11-20 | Disposition: A | Discharge status: Home / Self Care | Payer: 59 | Source: Ambulatory Visit | Attending: Endocrinology | Admitting: Endocrinology

## 2016-11-20 DIAGNOSIS — E041 Nontoxic single thyroid nodule: Secondary | ICD-10-CM

## 2016-11-20 DIAGNOSIS — E042 Nontoxic multinodular goiter: Secondary | ICD-10-CM | POA: Diagnosis not present

## 2016-11-23 DIAGNOSIS — M25361 Other instability, right knee: Secondary | ICD-10-CM | POA: Diagnosis not present

## 2016-11-25 DIAGNOSIS — M25361 Other instability, right knee: Secondary | ICD-10-CM | POA: Diagnosis not present

## 2016-11-27 DIAGNOSIS — M25361 Other instability, right knee: Secondary | ICD-10-CM | POA: Diagnosis not present

## 2016-11-30 DIAGNOSIS — M25361 Other instability, right knee: Secondary | ICD-10-CM | POA: Diagnosis not present

## 2016-12-04 DIAGNOSIS — M25361 Other instability, right knee: Secondary | ICD-10-CM | POA: Diagnosis not present

## 2017-01-19 DIAGNOSIS — Z1231 Encounter for screening mammogram for malignant neoplasm of breast: Secondary | ICD-10-CM | POA: Diagnosis not present

## 2017-01-26 DIAGNOSIS — Z01419 Encounter for gynecological examination (general) (routine) without abnormal findings: Secondary | ICD-10-CM | POA: Diagnosis not present

## 2017-01-26 DIAGNOSIS — Z6824 Body mass index (BMI) 24.0-24.9, adult: Secondary | ICD-10-CM | POA: Diagnosis not present

## 2017-02-08 DIAGNOSIS — S83014A Lateral dislocation of right patella, initial encounter: Secondary | ICD-10-CM | POA: Diagnosis not present

## 2017-04-10 IMAGING — US US THYROID
1 series · 14 of 25 positions shown · non-contrast
Comparison: 07/25/2014, 08/22/2010

CLINICAL DATA: Thyroid nodule. Previous FNA biopsy of left lower
nodule 09/23/2010.

EXAM:
THYROID ULTRASOUND
TECHNIQUE: Ultrasound examination of the thyroid gland and adjacent soft
tissues was performed.

[Series 1: us thyroid · 0.05mm/px · 14 of 61 slices shown]
[im 1/61]
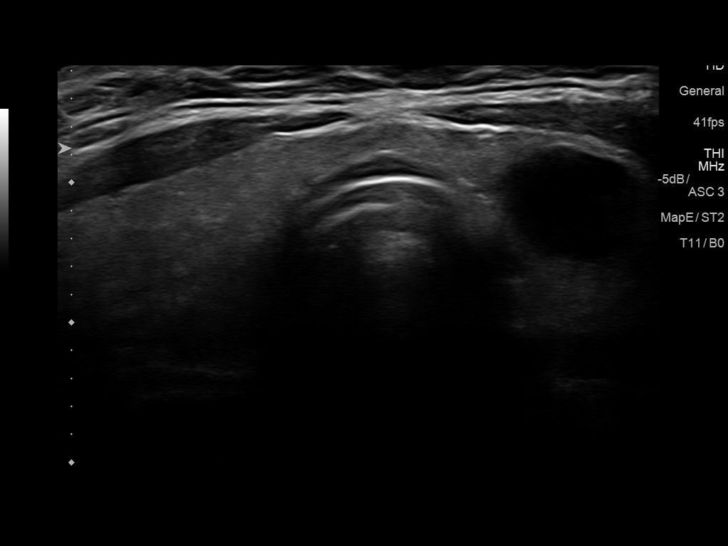
[im 6/61]
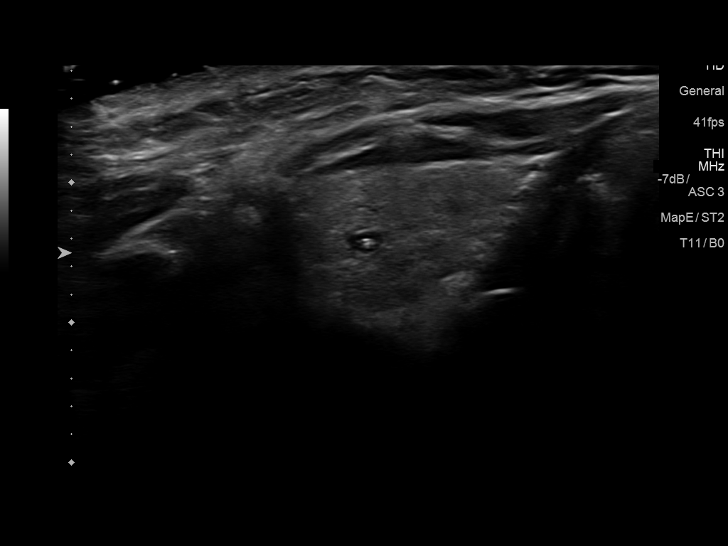
[im 11/61]
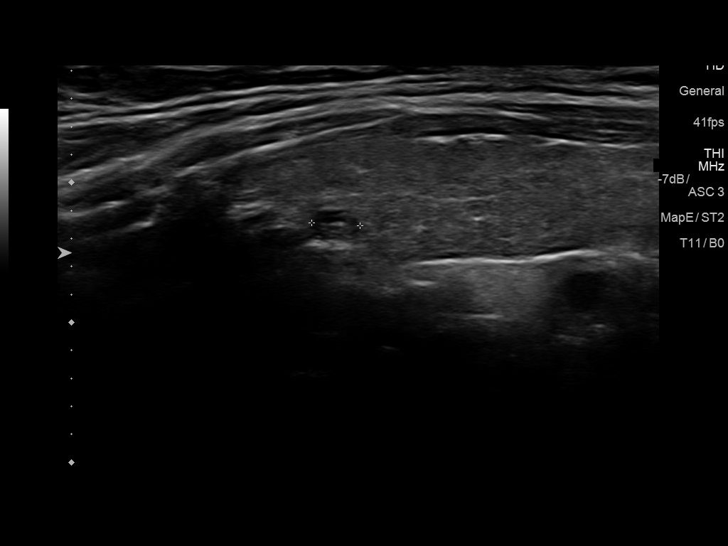
[im 16/61]
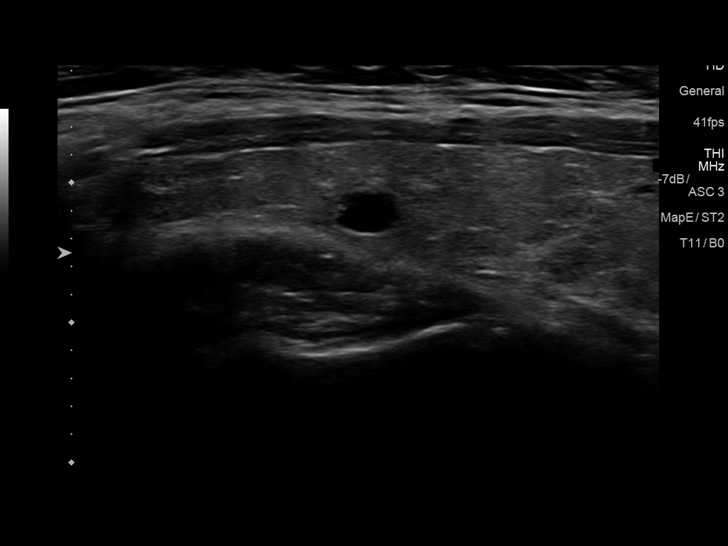
[im 21/61]
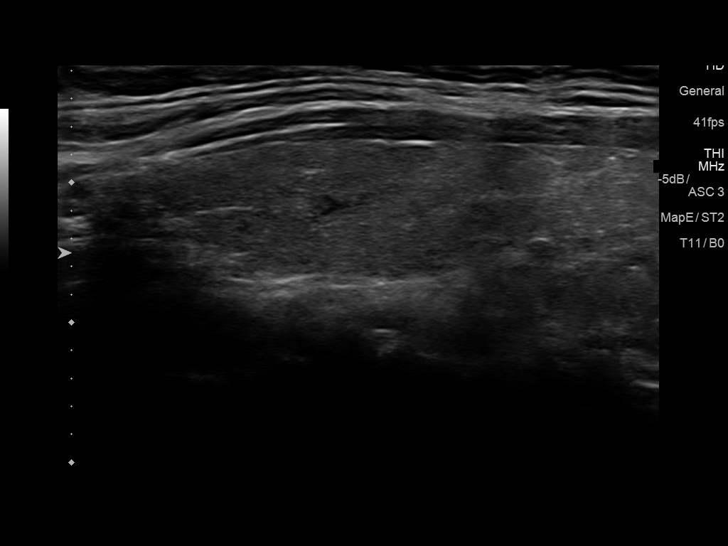
[im 23/61]
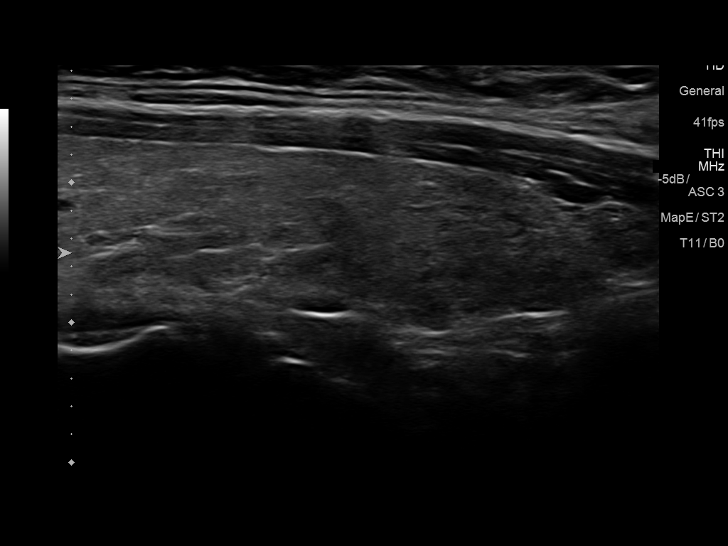
[im 28/61]
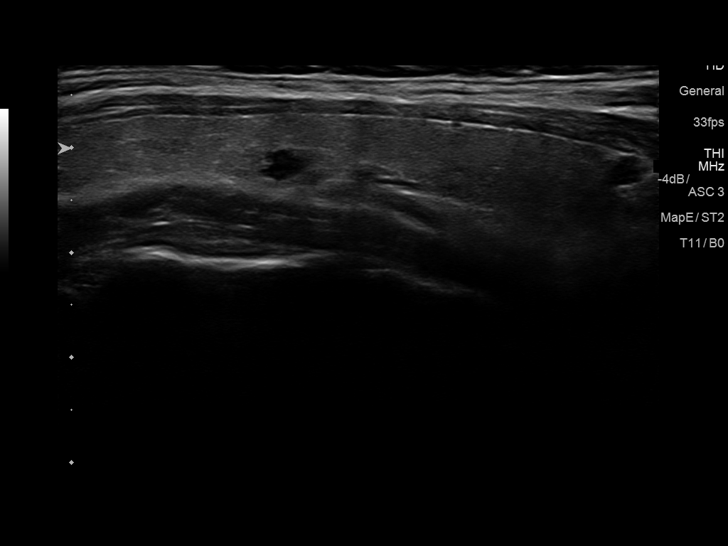
[im 33/61]
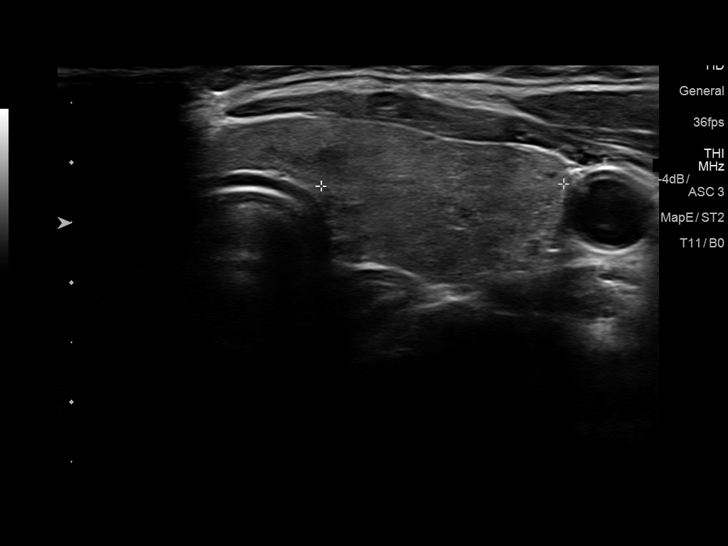
[im 38/61]
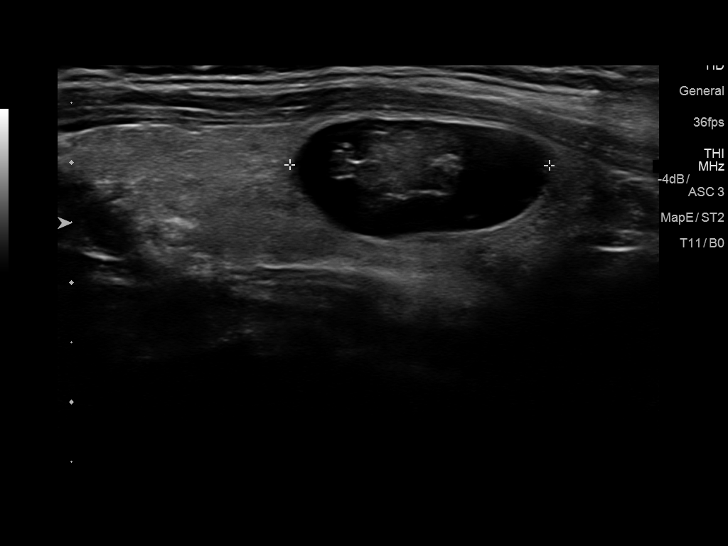
[im 41/61]
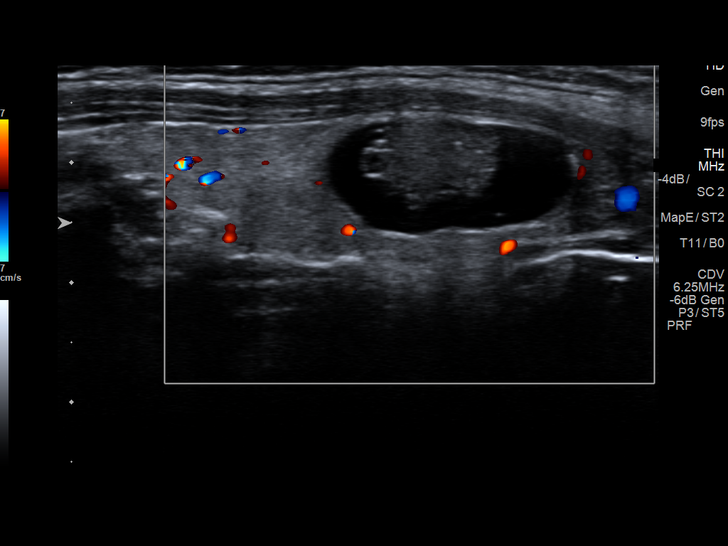
[im 46/61]
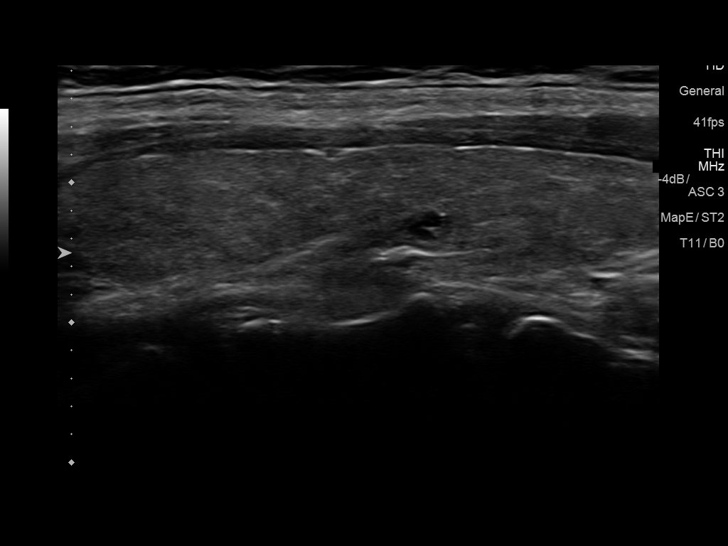
[im 51/61]
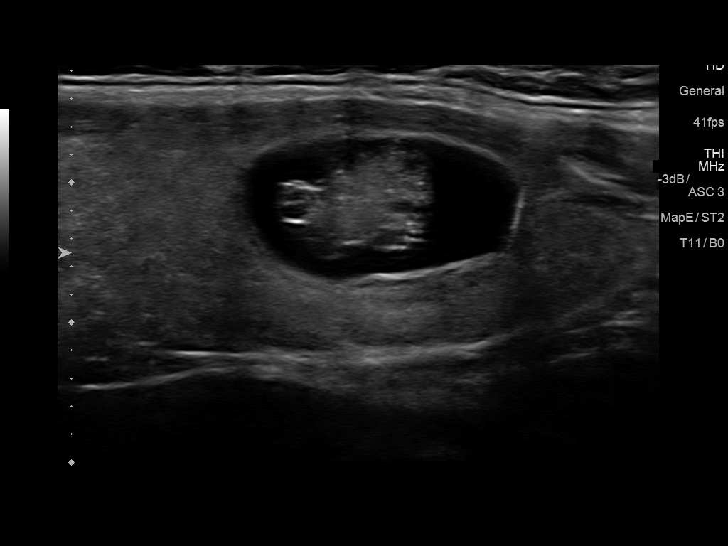
[im 56/61]
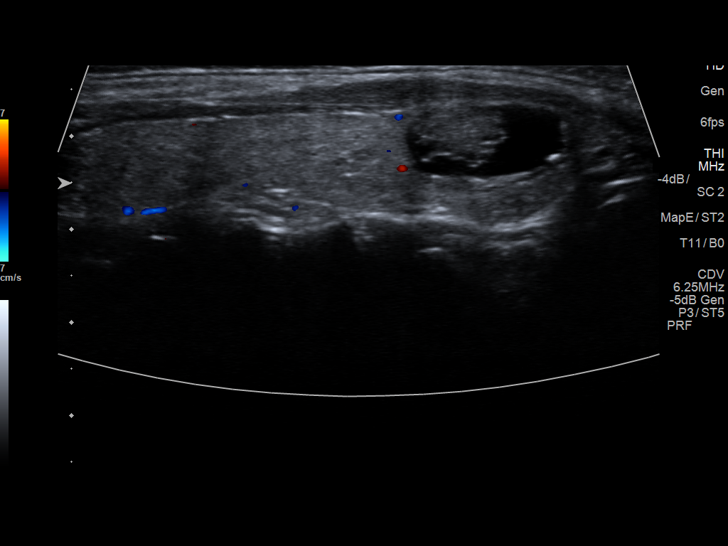
[im 61/61]
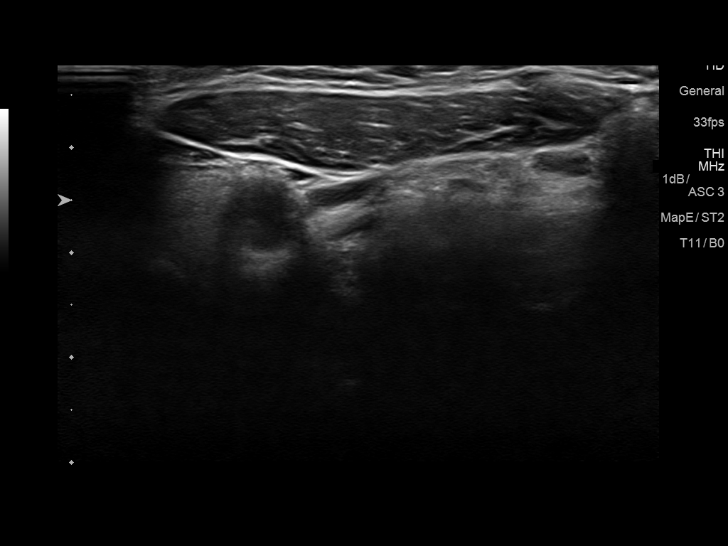

[14 of 25 positions shown; findings below may reference images not displayed]

FINDINGS: Parenchymal Echotexture: Mildly heterogenous

Isthmus: 0.3 cm thickness, previously

Right lobe: 6.2 x 1.3 x 2.1 cm (previously 6.8 x 1.1 x 2.1)

Left lobe: 5.8 x 1.5 x 2 cm (previously 6.5 x 1.1 x 2)

_________________________________________________________

Estimated total number of nodules >/= 1 cm: 1

Number of spongiform nodules >/=  2 cm not described below (TR1): 0

Number of mixed cystic and solid nodules >/= 1.5 cm not described
below (TR2): 0

At least 3 cystic nodules in the superior pole right lobe, none
greater than 4 mm.

2.2 x 1 x 1.7 cm mixed solid/cystic inferior left nodule, previously
1.2 x 0.5 x 1, previously biopsied.

0.4 cm complex cyst, mid left lobe
IMPRESSION: 1. Little change in thyromegaly.
2. Bilateral cystic lesions as above. None meet current criteria for
biopsy or dedicated imaging follow-up.

The above is in keeping with the ACR TI-RADS recommendations - [HOSPITAL] 4976;[DATE].

## 2017-12-13 DIAGNOSIS — N6310 Unspecified lump in the right breast, unspecified quadrant: Secondary | ICD-10-CM | POA: Diagnosis not present

## 2017-12-15 ENCOUNTER — Other Ambulatory Visit: Payer: Self-pay | Admitting: Obstetrics and Gynecology

## 2017-12-15 DIAGNOSIS — N6310 Unspecified lump in the right breast, unspecified quadrant: Secondary | ICD-10-CM

## 2017-12-15 DIAGNOSIS — N6459 Other signs and symptoms in breast: Secondary | ICD-10-CM

## 2017-12-24 DIAGNOSIS — J399 Disease of upper respiratory tract, unspecified: Secondary | ICD-10-CM | POA: Diagnosis not present

## 2017-12-24 DIAGNOSIS — J020 Streptococcal pharyngitis: Secondary | ICD-10-CM | POA: Diagnosis not present

## 2017-12-28 DIAGNOSIS — R591 Generalized enlarged lymph nodes: Secondary | ICD-10-CM | POA: Diagnosis not present

## 2017-12-30 DIAGNOSIS — E041 Nontoxic single thyroid nodule: Secondary | ICD-10-CM | POA: Diagnosis not present

## 2017-12-30 DIAGNOSIS — E785 Hyperlipidemia, unspecified: Secondary | ICD-10-CM | POA: Diagnosis not present

## 2017-12-30 DIAGNOSIS — M542 Cervicalgia: Secondary | ICD-10-CM | POA: Diagnosis not present

## 2017-12-30 DIAGNOSIS — E042 Nontoxic multinodular goiter: Secondary | ICD-10-CM | POA: Diagnosis not present

## 2017-12-30 DIAGNOSIS — M47812 Spondylosis without myelopathy or radiculopathy, cervical region: Secondary | ICD-10-CM | POA: Diagnosis not present

## 2018-02-02 DIAGNOSIS — N390 Urinary tract infection, site not specified: Secondary | ICD-10-CM | POA: Diagnosis not present

## 2018-02-02 DIAGNOSIS — E785 Hyperlipidemia, unspecified: Secondary | ICD-10-CM | POA: Diagnosis not present

## 2018-02-17 DIAGNOSIS — Z0000 Encounter for general adult medical examination without abnormal findings: Secondary | ICD-10-CM | POA: Diagnosis not present

## 2018-02-17 DIAGNOSIS — E041 Nontoxic single thyroid nodule: Secondary | ICD-10-CM | POA: Diagnosis not present

## 2018-02-17 DIAGNOSIS — E785 Hyperlipidemia, unspecified: Secondary | ICD-10-CM | POA: Diagnosis not present

## 2018-03-03 ENCOUNTER — Ambulatory Visit
Admission: RE | Admit: 2018-03-03 | Discharge: 2018-03-03 | Disposition: A | Discharge status: Home / Self Care | Payer: 59 | Source: Ambulatory Visit | Attending: Obstetrics and Gynecology | Admitting: Obstetrics and Gynecology

## 2018-03-03 DIAGNOSIS — N6459 Other signs and symptoms in breast: Secondary | ICD-10-CM

## 2018-03-03 DIAGNOSIS — N6313 Unspecified lump in the right breast, lower outer quadrant: Secondary | ICD-10-CM | POA: Diagnosis not present

## 2018-03-03 DIAGNOSIS — N6310 Unspecified lump in the right breast, unspecified quadrant: Secondary | ICD-10-CM

## 2018-03-03 DIAGNOSIS — R922 Inconclusive mammogram: Secondary | ICD-10-CM | POA: Diagnosis not present

## 2018-03-03 DIAGNOSIS — N6314 Unspecified lump in the right breast, lower inner quadrant: Secondary | ICD-10-CM | POA: Diagnosis not present

## 2018-05-04 DIAGNOSIS — Z6824 Body mass index (BMI) 24.0-24.9, adult: Secondary | ICD-10-CM | POA: Diagnosis not present

## 2018-05-04 DIAGNOSIS — Z01419 Encounter for gynecological examination (general) (routine) without abnormal findings: Secondary | ICD-10-CM | POA: Diagnosis not present

## 2018-08-17 DIAGNOSIS — Z23 Encounter for immunization: Secondary | ICD-10-CM | POA: Diagnosis not present

## 2018-10-24 DIAGNOSIS — M546 Pain in thoracic spine: Secondary | ICD-10-CM | POA: Diagnosis not present

## 2018-10-24 DIAGNOSIS — M47814 Spondylosis without myelopathy or radiculopathy, thoracic region: Secondary | ICD-10-CM | POA: Diagnosis not present

## 2018-10-31 DIAGNOSIS — N200 Calculus of kidney: Secondary | ICD-10-CM | POA: Diagnosis not present

## 2018-11-08 DIAGNOSIS — J029 Acute pharyngitis, unspecified: Secondary | ICD-10-CM | POA: Diagnosis not present

## 2018-11-14 DIAGNOSIS — R1310 Dysphagia, unspecified: Secondary | ICD-10-CM | POA: Diagnosis not present

## 2020-03-19 ENCOUNTER — Other Ambulatory Visit: Payer: Self-pay | Admitting: Internal Medicine

## 2020-03-19 DIAGNOSIS — E041 Nontoxic single thyroid nodule: Secondary | ICD-10-CM

## 2020-04-01 ENCOUNTER — Other Ambulatory Visit: Payer: 59

## 2020-04-12 ENCOUNTER — Ambulatory Visit
Admission: RE | Admit: 2020-04-12 | Discharge: 2020-04-12 | Disposition: A | Discharge status: Home / Self Care | Payer: 59 | Source: Ambulatory Visit | Attending: Internal Medicine | Admitting: Internal Medicine

## 2020-04-12 DIAGNOSIS — E041 Nontoxic single thyroid nodule: Secondary | ICD-10-CM

## 2020-11-05 IMAGING — US US THYROID
1 series · 13 of 25 positions shown · non-contrast
Comparison: 11/20/2016, 07/25/2014, biopsy 09/23/2010

CLINICAL DATA: 43-year-old female with a history thyroid nodule,
prior biopsy left lower nodule 09/23/2010

EXAM:
THYROID ULTRASOUND
TECHNIQUE: Ultrasound examination of the thyroid gland and adjacent soft
tissues was performed.

[Series 1: us thyroid · 0.04mm/px · 13 of 71 slices shown]
[im 1/71]
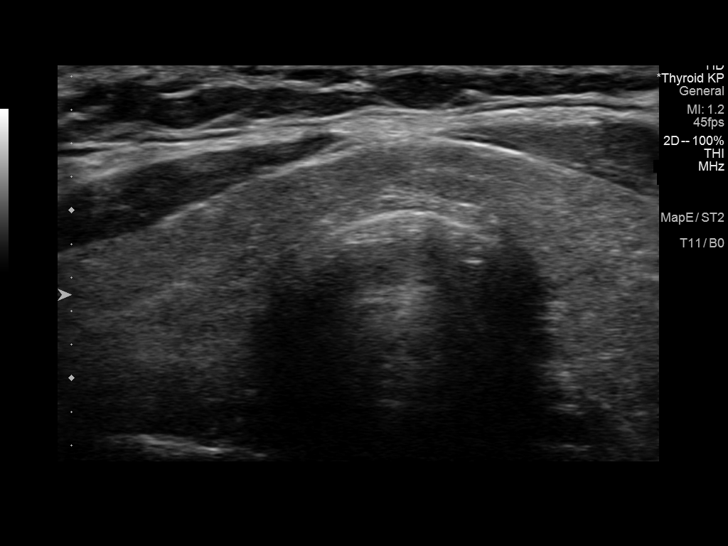
[im 6/71]
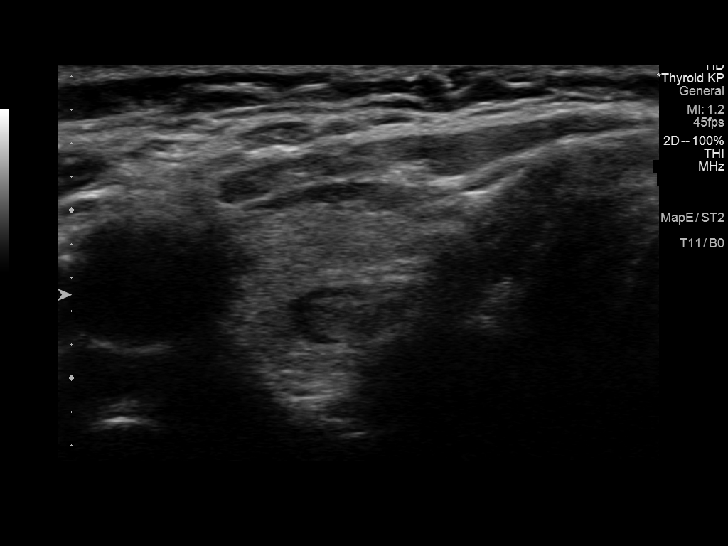
[im 12/71]
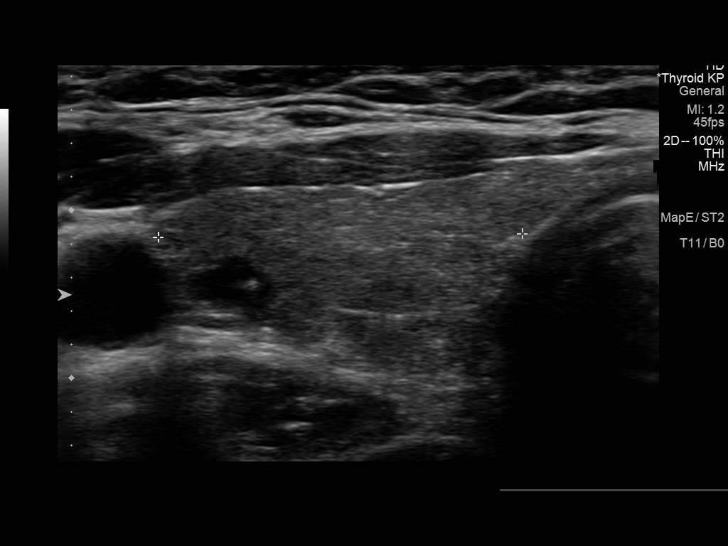
[im 18/71]
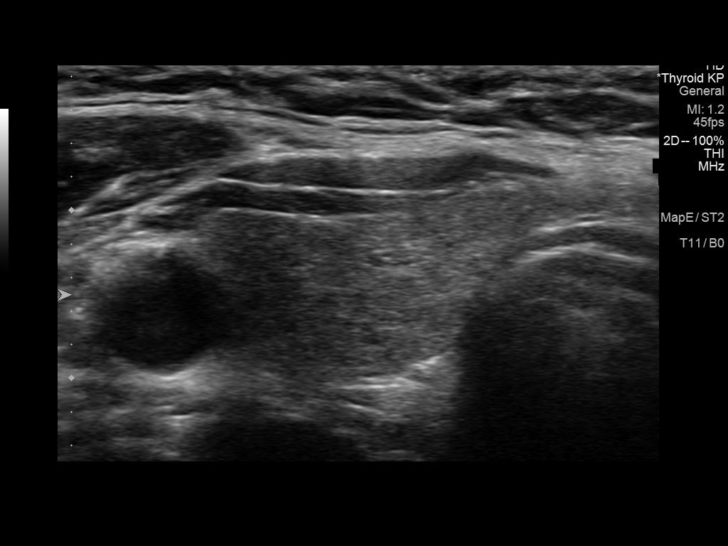
[im 24/71]
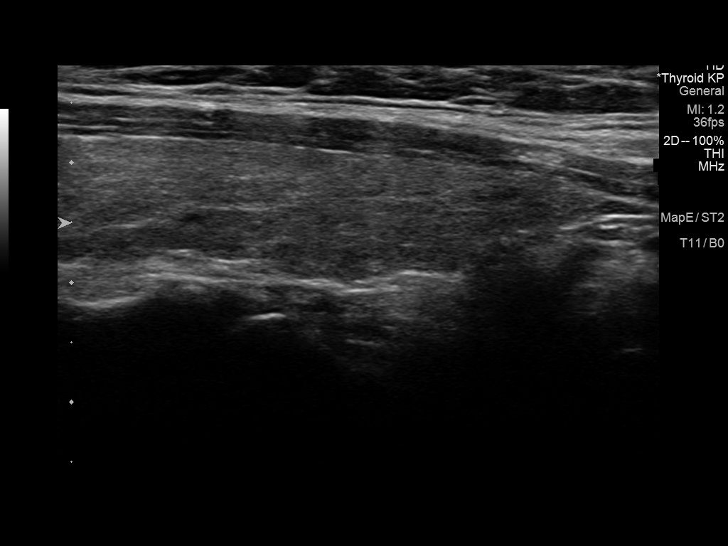
[im 30/71]
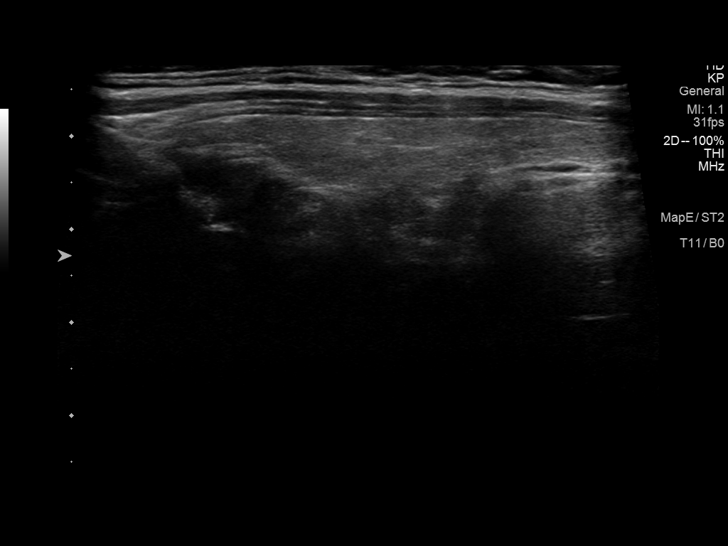
[im 36/71]
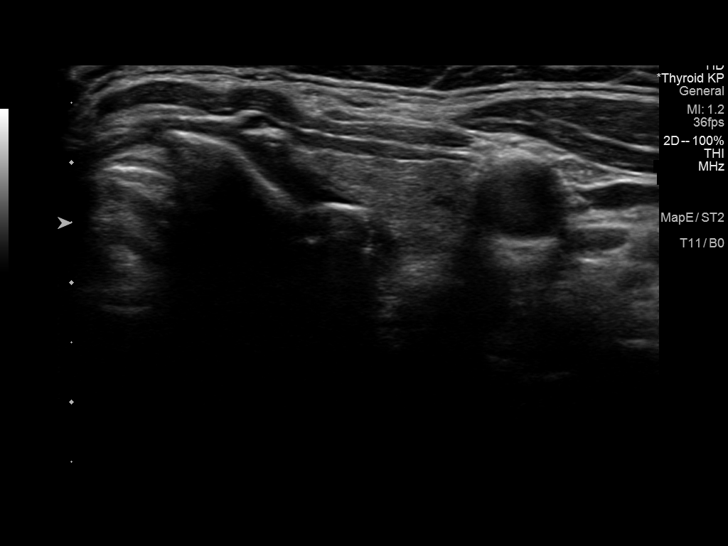
[im 41/71]
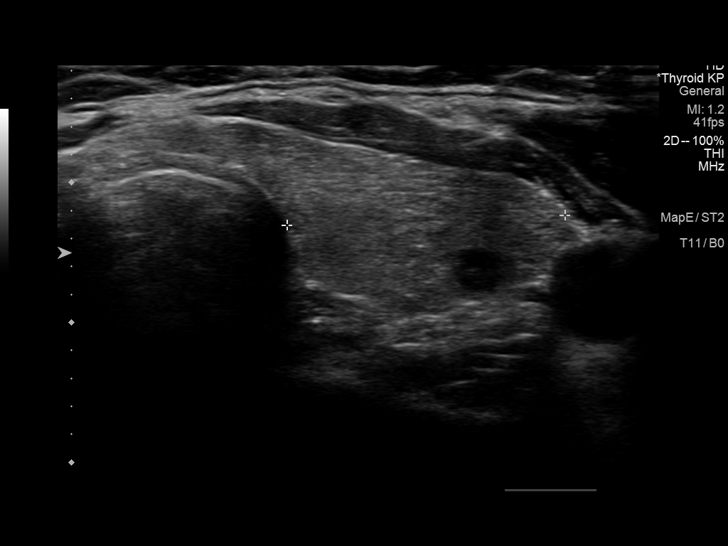
[im 47/71]
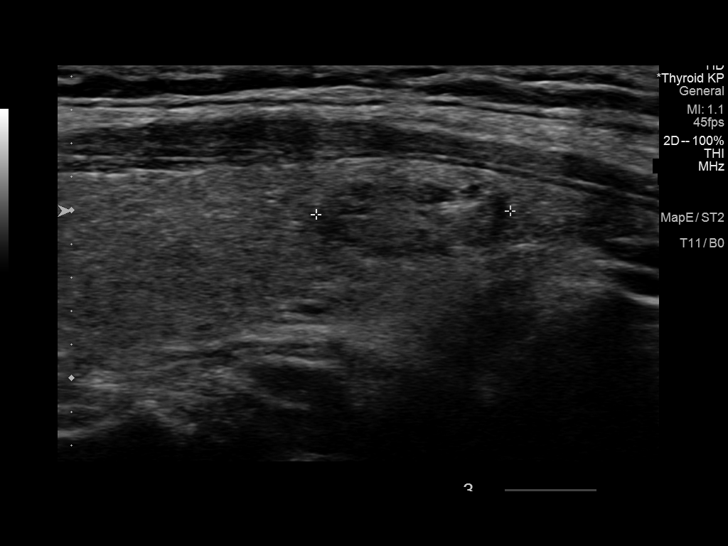
[im 53/71]
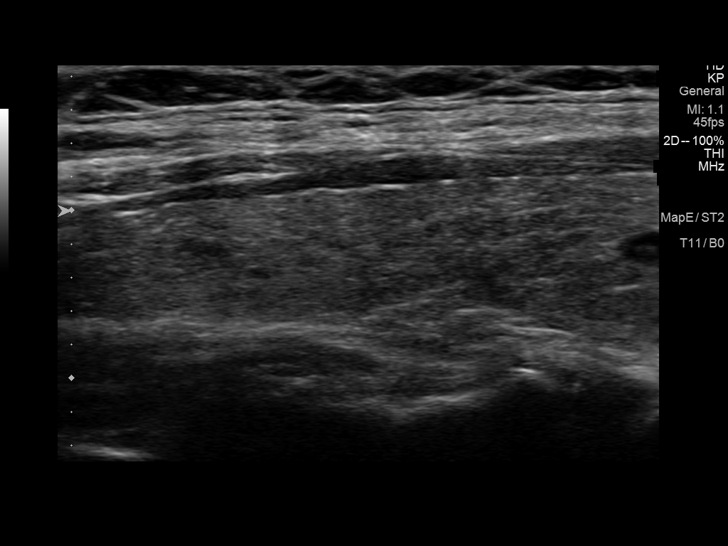
[im 59/71]
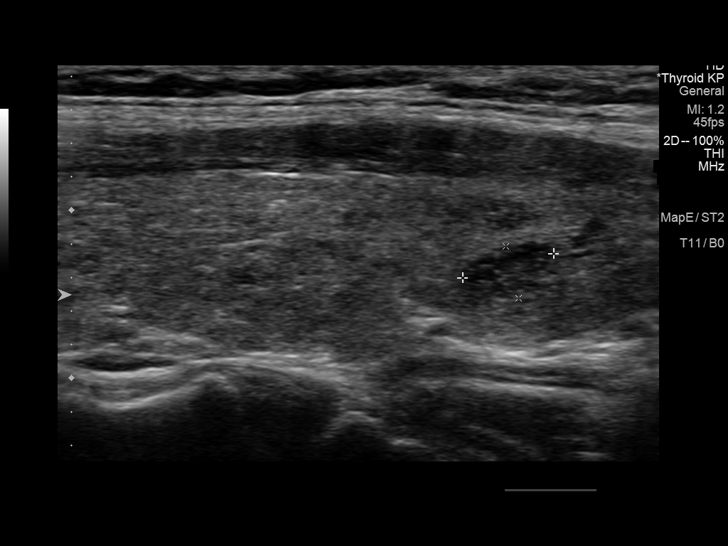
[im 65/71]
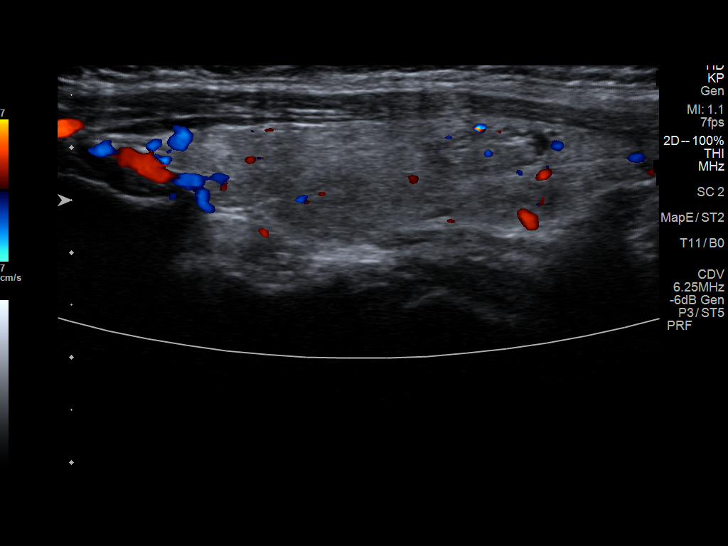
[im 71/71]
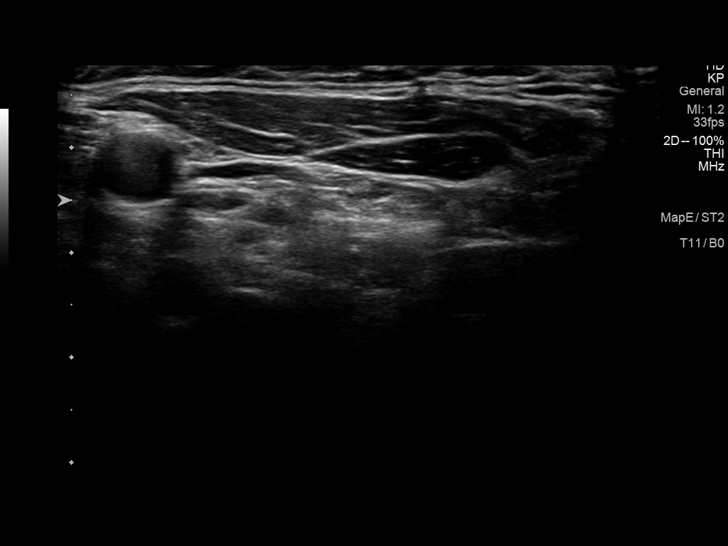

[13 of 25 positions shown; findings below may reference images not displayed]

FINDINGS: Parenchymal Echotexture: Mildly heterogenous

Isthmus: 0.3 cm

Right lobe: 5.8 cm x 1.2 cm x 2.2 cm

Left lobe: 6.0 cm x 1.1 cm x 2.0 cm

_________________________________________________________

Estimated total number of nodules >/= 1 cm: 1

Number of spongiform nodules >/=  2 cm not described below (TR1): 0

Number of mixed cystic and solid nodules >/= 1.5 cm not described
below (TR2): 0

_________________________________________________________

Cystic/partially cystic right-sided thyroid nodules, do not meet
criteria for surveillance or biopsy.

Nodule labeled 3 in the left thyroid is spongiform, with previous
biopsy, and does not meet criteria for further surveillance or
biopsy.

Nodule labeled 4 inferior left thyroid, spongiform, and does not
meet criteria for surveillance or biopsy.

No adenopathy.
IMPRESSION: Mildly heterogeneous thyroid compatible with medical thyroid
disease.

No thyroid nodule meets criteria for biopsy or surveillance, as
designated by the newly established ACR TI-RADS criteria.

Recommendations follow those established by the new ACR TI-RADS
criteria ([HOSPITAL] 4669;[DATE]).

## 2021-03-19 ENCOUNTER — Other Ambulatory Visit: Payer: Self-pay | Admitting: Internal Medicine

## 2021-03-19 DIAGNOSIS — Z8249 Family history of ischemic heart disease and other diseases of the circulatory system: Secondary | ICD-10-CM

## 2022-03-20 ENCOUNTER — Other Ambulatory Visit: Payer: Self-pay | Admitting: Internal Medicine

## 2022-03-20 DIAGNOSIS — Z8249 Family history of ischemic heart disease and other diseases of the circulatory system: Secondary | ICD-10-CM

## 2022-06-23 ENCOUNTER — Ambulatory Visit
Admission: RE | Admit: 2022-06-23 | Discharge: 2022-06-23 | Disposition: A | Discharge status: Home / Self Care | Payer: No Typology Code available for payment source | Source: Ambulatory Visit | Attending: Internal Medicine | Admitting: Internal Medicine

## 2022-06-23 DIAGNOSIS — Z8249 Family history of ischemic heart disease and other diseases of the circulatory system: Secondary | ICD-10-CM

## 2022-07-11 DIAGNOSIS — S8254XA Nondisplaced fracture of medial malleolus of right tibia, initial encounter for closed fracture: Secondary | ICD-10-CM | POA: Diagnosis not present

## 2022-07-13 DIAGNOSIS — M25571 Pain in right ankle and joints of right foot: Secondary | ICD-10-CM | POA: Diagnosis not present

## 2022-10-22 DIAGNOSIS — R030 Elevated blood-pressure reading, without diagnosis of hypertension: Secondary | ICD-10-CM | POA: Diagnosis not present

## 2022-10-22 DIAGNOSIS — G44219 Episodic tension-type headache, not intractable: Secondary | ICD-10-CM | POA: Diagnosis not present

## 2022-10-27 ENCOUNTER — Other Ambulatory Visit: Payer: Self-pay | Admitting: *Deleted

## 2022-10-27 DIAGNOSIS — I998 Other disorder of circulatory system: Secondary | ICD-10-CM

## 2022-11-09 ENCOUNTER — Ambulatory Visit (HOSPITAL_COMMUNITY): Payer: BC Managed Care – PPO

## 2022-11-09 ENCOUNTER — Encounter: Payer: BC Managed Care – PPO | Admitting: Surgery

## 2023-03-19 DIAGNOSIS — Z0000 Encounter for general adult medical examination without abnormal findings: Secondary | ICD-10-CM | POA: Diagnosis not present

## 2023-03-23 DIAGNOSIS — E7800 Pure hypercholesterolemia, unspecified: Secondary | ICD-10-CM | POA: Diagnosis not present

## 2023-03-23 DIAGNOSIS — Z0000 Encounter for general adult medical examination without abnormal findings: Secondary | ICD-10-CM | POA: Diagnosis not present

## 2023-03-23 DIAGNOSIS — D72829 Elevated white blood cell count, unspecified: Secondary | ICD-10-CM | POA: Diagnosis not present

## 2023-03-23 DIAGNOSIS — Z23 Encounter for immunization: Secondary | ICD-10-CM | POA: Diagnosis not present

## 2023-03-23 DIAGNOSIS — L308 Other specified dermatitis: Secondary | ICD-10-CM | POA: Diagnosis not present

## 2023-03-23 DIAGNOSIS — I998 Other disorder of circulatory system: Secondary | ICD-10-CM | POA: Diagnosis not present

## 2023-04-06 DIAGNOSIS — D72829 Elevated white blood cell count, unspecified: Secondary | ICD-10-CM | POA: Diagnosis not present

## 2023-04-26 ENCOUNTER — Other Ambulatory Visit: Payer: Self-pay | Admitting: *Deleted

## 2023-04-26 DIAGNOSIS — D509 Iron deficiency anemia, unspecified: Secondary | ICD-10-CM

## 2023-05-25 ENCOUNTER — Inpatient Hospital Stay: Payer: 59 | Admitting: Oncology

## 2023-05-25 ENCOUNTER — Inpatient Hospital Stay: Payer: 59 | Attending: Oncology

## 2023-05-25 DIAGNOSIS — D509 Iron deficiency anemia, unspecified: Secondary | ICD-10-CM | POA: Insufficient documentation

## 2023-05-25 LAB — CBC WITH DIFFERENTIAL (CANCER CENTER ONLY)
Abs Immature Granulocytes: 0.04 10*3/uL (ref 0.00–0.07)
Basophils Absolute: 0.1 10*3/uL (ref 0.0–0.1)
Basophils Relative: 1 %
Eosinophils Absolute: 0.3 10*3/uL (ref 0.0–0.5)
Eosinophils Relative: 3 %
HCT: 38.4 % (ref 36.0–46.0)
Hemoglobin: 12.2 g/dL (ref 12.0–15.0)
Immature Granulocytes: 0 %
Lymphocytes Relative: 23 %
Lymphs Abs: 2.3 10*3/uL (ref 0.7–4.0)
MCH: 21.4 pg — ABNORMAL LOW (ref 26.0–34.0)
MCHC: 31.8 g/dL (ref 30.0–36.0)
MCV: 67.5 fL — ABNORMAL LOW (ref 80.0–100.0)
Monocytes Absolute: 0.7 10*3/uL (ref 0.1–1.0)
Monocytes Relative: 7 %
Neutro Abs: 6.5 10*3/uL (ref 1.7–7.7)
Neutrophils Relative %: 66 %
Platelet Count: 428 10*3/uL — ABNORMAL HIGH (ref 150–400)
RBC: 5.69 MIL/uL — ABNORMAL HIGH (ref 3.87–5.11)
RDW: 15.5 % (ref 11.5–15.5)
WBC Count: 9.9 10*3/uL (ref 4.0–10.5)
nRBC: 0 % (ref 0.0–0.2)

## 2023-05-25 LAB — SAVE SMEAR(SSMR), FOR PROVIDER SLIDE REVIEW

## 2023-06-08 ENCOUNTER — Inpatient Hospital Stay: Payer: 59 | Admitting: Oncology

## 2023-06-23 ENCOUNTER — Inpatient Hospital Stay: Payer: 59 | Attending: Oncology | Admitting: Oncology

## 2023-06-23 ENCOUNTER — Inpatient Hospital Stay: Payer: 59

## 2023-06-23 VITALS — BP 142/85 | HR 75 | Temp 98.2°F | Resp 20 | Ht 63.0 in | Wt 163.3 lb

## 2023-06-23 DIAGNOSIS — D75839 Thrombocytosis, unspecified: Secondary | ICD-10-CM | POA: Diagnosis not present

## 2023-06-23 DIAGNOSIS — Z23 Encounter for immunization: Secondary | ICD-10-CM | POA: Insufficient documentation

## 2023-06-23 DIAGNOSIS — L299 Pruritus, unspecified: Secondary | ICD-10-CM | POA: Diagnosis not present

## 2023-06-23 DIAGNOSIS — D509 Iron deficiency anemia, unspecified: Secondary | ICD-10-CM

## 2023-06-23 LAB — IRON AND TIBC
Iron: 89 ug/dL (ref 28–170)
Saturation Ratios: 20 % (ref 10.4–31.8)
TIBC: 437 ug/dL (ref 250–450)
UIBC: 348 ug/dL

## 2023-06-23 LAB — CBC WITH DIFFERENTIAL (CANCER CENTER ONLY)
Abs Immature Granulocytes: 0.02 10*3/uL (ref 0.00–0.07)
Basophils Absolute: 0.1 10*3/uL (ref 0.0–0.1)
Basophils Relative: 1 %
Eosinophils Absolute: 0.4 10*3/uL (ref 0.0–0.5)
Eosinophils Relative: 5 %
HCT: 40.5 % (ref 36.0–46.0)
Hemoglobin: 12.4 g/dL (ref 12.0–15.0)
Immature Granulocytes: 0 %
Lymphocytes Relative: 27 %
Lymphs Abs: 2.3 10*3/uL (ref 0.7–4.0)
MCH: 21.1 pg — ABNORMAL LOW (ref 26.0–34.0)
MCHC: 30.6 g/dL (ref 30.0–36.0)
MCV: 68.9 fL — ABNORMAL LOW (ref 80.0–100.0)
Monocytes Absolute: 0.6 10*3/uL (ref 0.1–1.0)
Monocytes Relative: 7 %
Neutro Abs: 5 10*3/uL (ref 1.7–7.7)
Neutrophils Relative %: 60 %
Platelet Count: 410 10*3/uL — ABNORMAL HIGH (ref 150–400)
RBC: 5.88 MIL/uL — ABNORMAL HIGH (ref 3.87–5.11)
RDW: 15.7 % — ABNORMAL HIGH (ref 11.5–15.5)
WBC Count: 8.3 10*3/uL (ref 4.0–10.5)
nRBC: 0 % (ref 0.0–0.2)

## 2023-06-23 LAB — FERRITIN: Ferritin: 46 ng/mL (ref 11–307)

## 2023-06-23 MED ORDER — INFLUENZA VIRUS VACC SPLIT PF (FLUZONE) 0.5 ML IM SUSY
0.5000 mL | PREFILLED_SYRINGE | INTRAMUSCULAR | Status: AC
  Administered 2023-06-23: 0.5 mL via INTRAMUSCULAR

## 2023-06-23 MED ORDER — INFLUENZA VIRUS VACC SPLIT PF (FLUZONE) 0.5 ML IM SUSY
0.5000 mL | PREFILLED_SYRINGE | INTRAMUSCULAR | Status: DC
  Filled 2023-06-23: qty 0.5

## 2023-06-23 NOTE — Progress Notes (Signed)
[facility] New Patient Consult   Requesting MD: Darci Needle, Md [address] [place],  [place] [address]   Zoe Brown 47 y.o.  09/03/1976    Reason for Consult: Microcytic anemia   HPI: Zoe Brown reports a history of microcytic anemia for the past few years.  A CBC on 03/19/2023 found the hemoglobin at 10.9, MCV 71, RDW 15.2, MCH 21.2, platelets 376,000, WBC 10.9 with an ANC of 7.9 lymphocyte count of 1.8.  The serum iron returned at 38, percent saturation 13 (15-5 5) and iron binding capacity 301. On 04/06/2023 the hemoglobin returned at 11, MCV 70, platelets 440,000.  Iron studies on 03/18/2022 found the iron binding capacity at 359 and serum iron 104.  She has a monthly menstrual cycle.  No other bleeding.  She reports her children have a "low MCV ".  Review of the electronic medical record reveals a hemoglobin of 11.2 with an MCV of 71 on 06/16/2011.  On 04/03/2014 the hemoglobin returned at 11 with an MCV of 68.3.  Past Medical History:  Diagnosis Date   Anemia    GERD (gastroesophageal reflux disease)    Hyperlipidemia    Tachycardia    Thyromegaly    cyst-declined meds   Ulcer    hx of    .  G3, P2, 1 miscarriage   .  Pruritus with multiple skin lesions since October 2023-evaluated by dermatology  Past Surgical History:  Procedure Laterality Date   DILATION AND EVACUATION N/A 04/03/2014   Procedure: DILATATION AND EVACUATION;  Surgeon: Serita Kyle, MD;  Location: [facility];  Service: Gynecology;  Laterality: N/A;   WISDOM TOOTH EXTRACTION      Medications: Reviewed  Allergies: No Known Allergies  Family history: No family history of cancer.  No known history of thalassemia or a hemoglobinopathy  Social History:   He lives with her husband and children in [place].  She works in Consulting civil engineer.  She does not use cigarettes or alcohol.  No transfusion history.  No risk factor for HIV or hepatitis.  She is from [place].  ROS:   Positives  include: Pruritis-develops skin lesions when scratching, intermittent nose itching during allergy season  A complete ROS was otherwise negative.  Physical Exam:  Blood pressure (!) 142/85, pulse 75, temperature 98.2 F (36.8 C), temperature source Oral, resp. rate 20, height 5\' 3"  (1.6 m), weight 163 lb 4.8 oz (74.1 kg), SpO2 100%, unknown if currently breastfeeding.  HEENT: Oropharynx without visible mass, neck without mass Lungs: Clear bilaterally Cardiac: Regular rate and rhythm  Abdomen: No hepatosplenomegaly Vascular: No leg edema Lymph nodes: No cervical, supraclavicular, axillary, or inguinal nodes Neurologic: Alert and oriented, the motor exam appears intact in the upper and lower extremities bilaterally Skin: Multiple scabbed 3-4 mm lesions over the trunk and extremities, chiefly over the arms and legs.  Flat light brown 3-4 mm healed lesions. Musculoskeletal: No spine tenderness   LAB:  CBC  Lab Results  Component Value Date   WBC 8.3 06/23/2023   HGB 12.4 06/23/2023   HCT 40.5 06/23/2023   MCV 68.9 (L) 06/23/2023   PLT 410 (H) 06/23/2023   NEUTROABS 5.0 06/23/2023    Blood smear 05/25/2023: 1 -5 lobed neutrophil, the white cell morphology is otherwise unremarkable.  The platelets appear normal in number.  Few ovalocytes, target cells, and teardrops.  The polychromasia is not increased.   Iron 89, TIBC 437, percent saturation 20, ferritin 46  Assessment/Plan:  Microcytic anemia Normal iron studies 06/24/2023  Pruritus with multiple ulcerated skin lesions Report of children with a low MCV    Disposition:   Ms. Anuszewski is referred for evaluation of microcytic anemia.  She has a chronic history of mild microcytic anemia.  The hemoglobin is in the low normal range today.  Iron studies are normal today.  I suspect the anemia is largely due to a hemoglobinopathy (hemoglobin E) or a thalassemia variant.  There may be a component of intermittent iron deficiency related  to menorrhagia.  The mild thrombocytosis could be a normal variant, related to a component of iron deficiency, or inflammation from the numerous skin lesions.   We will check a hemoglobin electrophoresis today.  We will initiate alpha or beta globin gene testing as indicated.  She will return for an office visit and CBC in 4 months.  I recommend she be referred for colorectal cancer screening.  She has extensive ulcerated skin lesions over the extremities.  She relates these to scratching from pruritus.  I recommended she follow-up with dermatology for further evaluation and management.    Thornton Papas, MD  06/23/2023, 6:59 PM

## 2023-06-25 ENCOUNTER — Other Ambulatory Visit: Payer: Self-pay | Admitting: Nurse Practitioner

## 2023-06-25 ENCOUNTER — Other Ambulatory Visit: Payer: Self-pay | Admitting: *Deleted

## 2023-06-25 DIAGNOSIS — D509 Iron deficiency anemia, unspecified: Secondary | ICD-10-CM

## 2023-06-25 LAB — HGB FRACTIONATION CASCADE
Hgb A2: 2.3 % (ref 1.8–3.2)
Hgb A: 97.7 % (ref 96.4–98.8)
Hgb F: 0 % (ref 0.0–2.0)
Hgb S: 0 %

## 2023-06-29 ENCOUNTER — Encounter: Payer: Self-pay | Admitting: *Deleted

## 2023-07-14 LAB — ALPHA-THALASSEMIA GENOTYPR

## 2023-07-20 ENCOUNTER — Telehealth: Payer: Self-pay

## 2023-07-20 NOTE — Telephone Encounter (Signed)
Patient gave verbal understanding and had no further questions or concerns  

## 2023-07-20 NOTE — Telephone Encounter (Signed)
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

## 2023-12-23 ENCOUNTER — Inpatient Hospital Stay: Payer: 59 | Attending: Oncology | Admitting: Oncology

## 2023-12-23 ENCOUNTER — Other Ambulatory Visit: Payer: 59

## 2023-12-23 ENCOUNTER — Inpatient Hospital Stay: Payer: Self-pay
